# Patient Record
Sex: Female | Born: 1995 | Race: White | Hispanic: No | Marital: Single | Smoking: Never smoker
Health system: Southern US, Community
[De-identification: ages and names within clinical notes are randomized; demographics above are authoritative.]

## PROBLEM LIST (undated history)

## (undated) DIAGNOSIS — Z789 Other specified health status: Secondary | ICD-10-CM

## (undated) DIAGNOSIS — N390 Urinary tract infection, site not specified: Secondary | ICD-10-CM

## (undated) DIAGNOSIS — N200 Calculus of kidney: Secondary | ICD-10-CM

---
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
## 2017-01-27 IMAGING — US US TRANSVAGINAL NON-OB
1 series · 14 of 25 positions shown · non-contrast
Comparison: None

CLINICAL DATA: Pelvic pain since a spontaneous abortion 09/27/2014.



[Series 1: us pelvis complete · 14 of 66 slices shown]
[im 1/66]
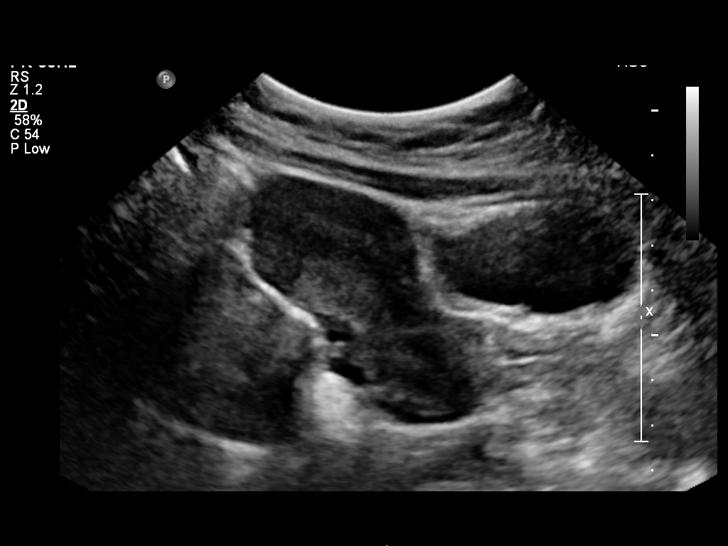
[im 6/66]
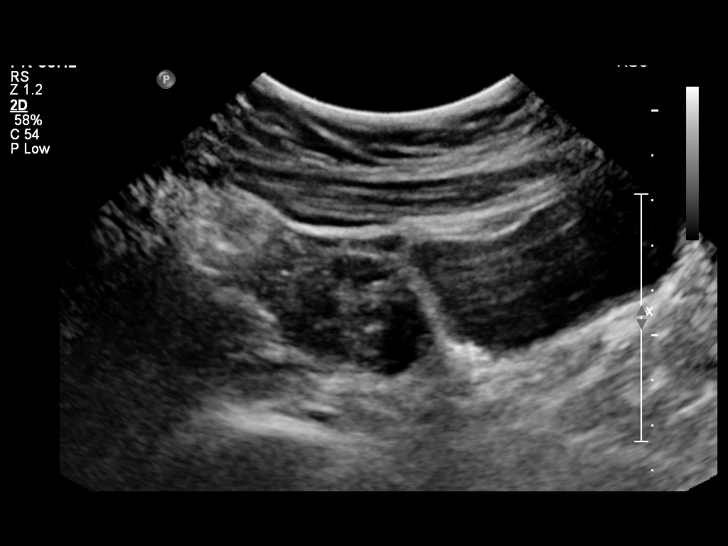
[im 11/66]
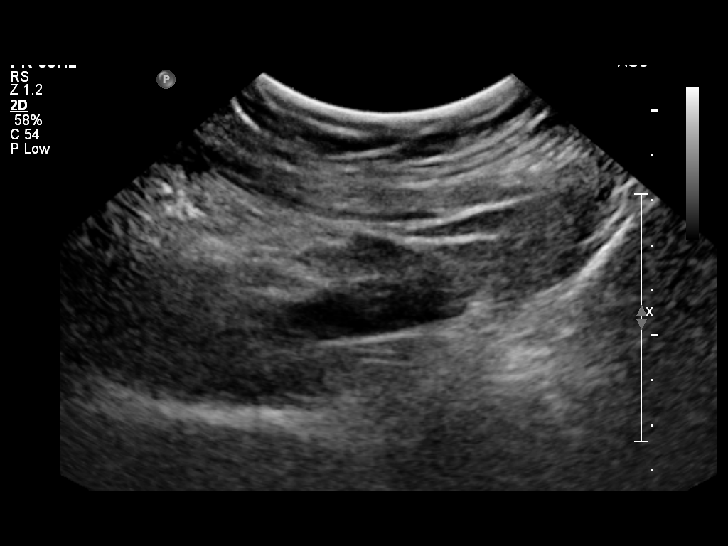
[im 17/66]
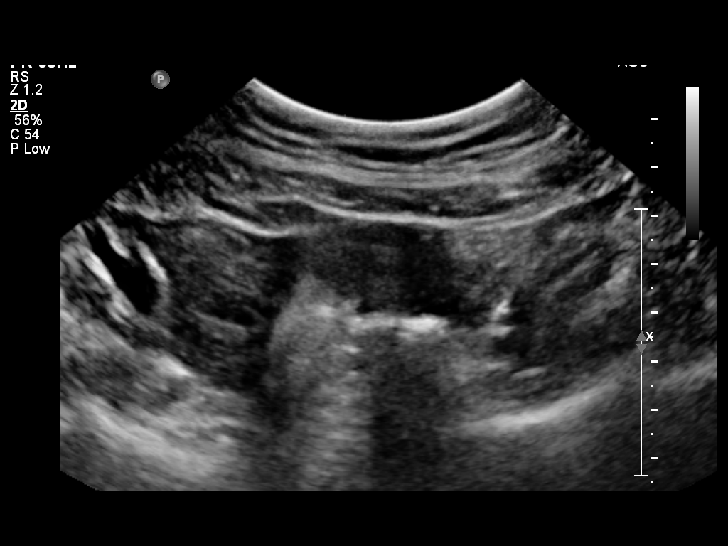
[im 22/66]
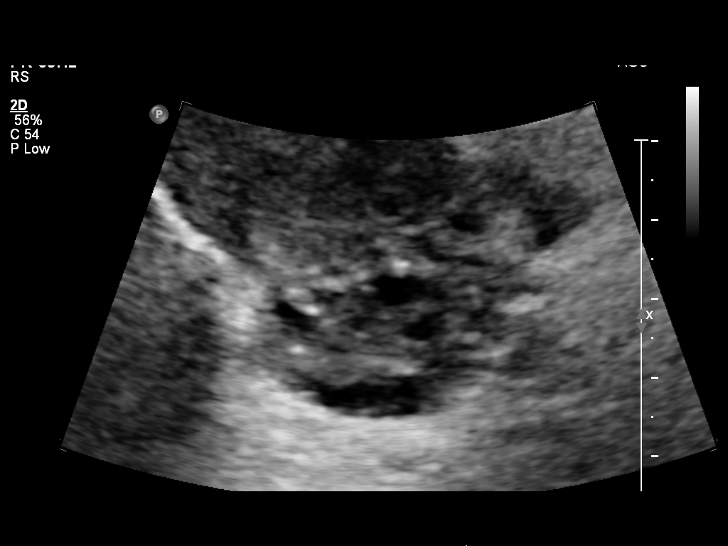
[im 25/66]
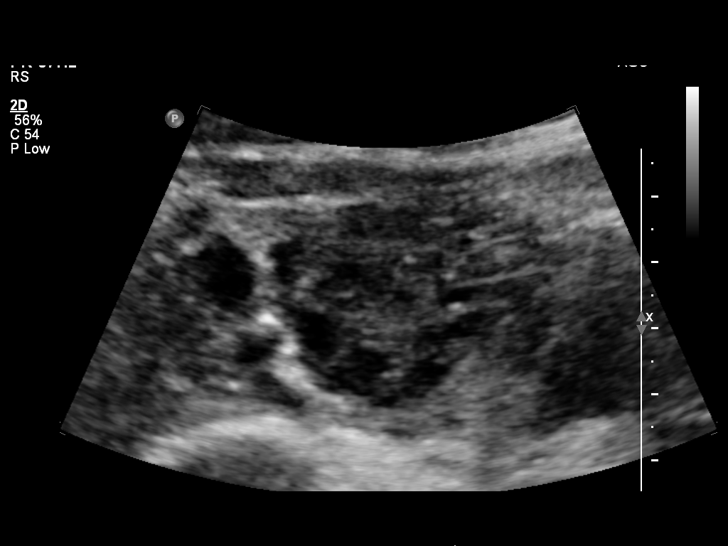
[im 30/66]
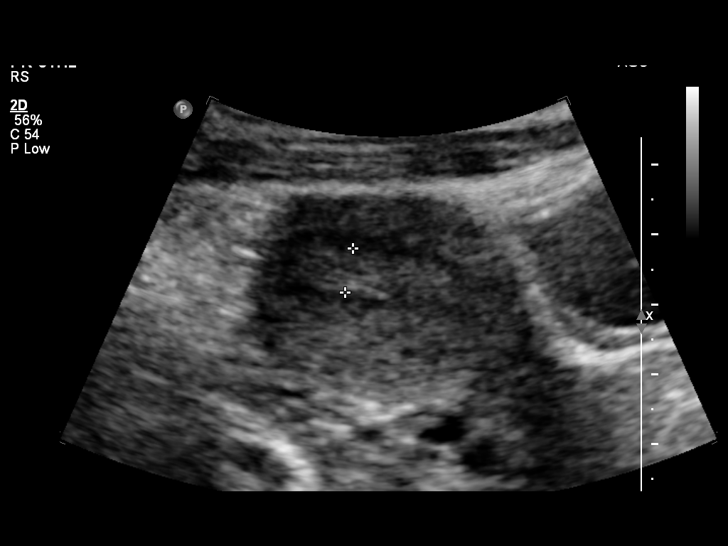
[im 36/66]
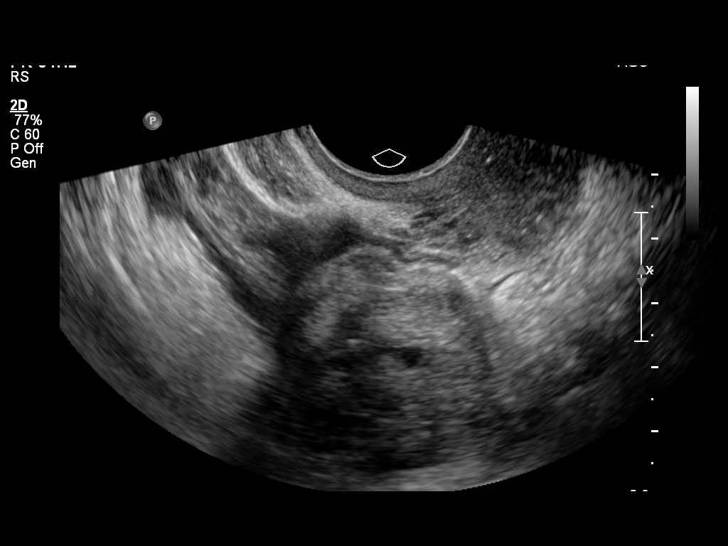
[im 41/66]
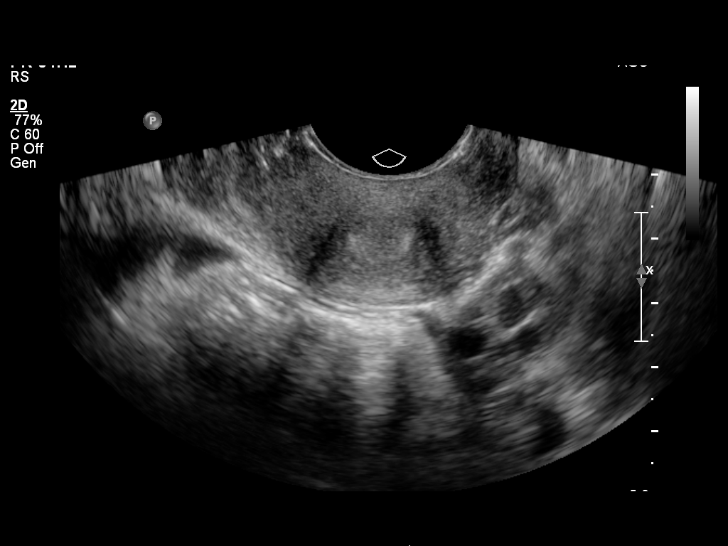
[im 44/66]
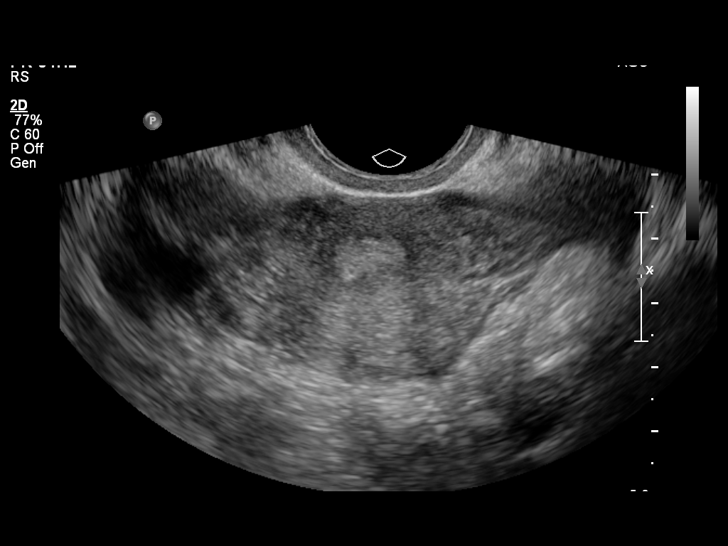
[im 49/66]
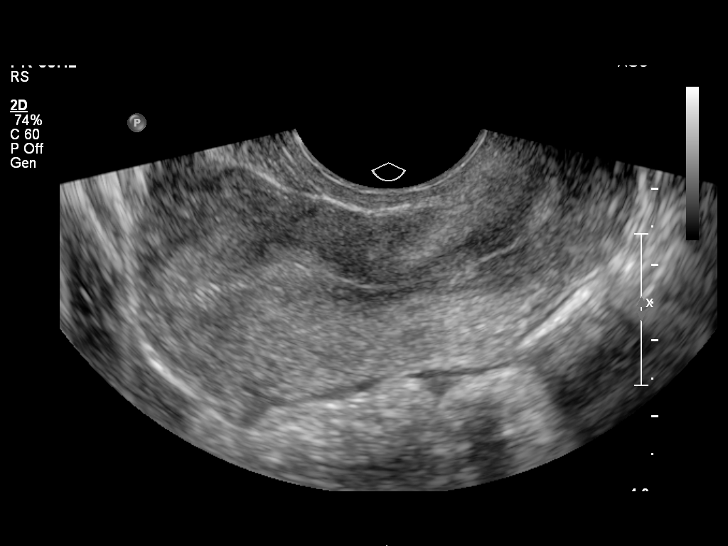
[im 55/66]
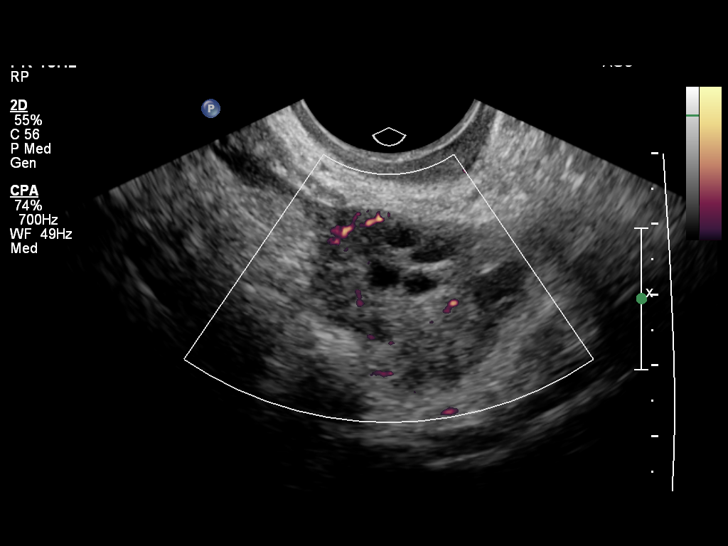
[im 60/66]
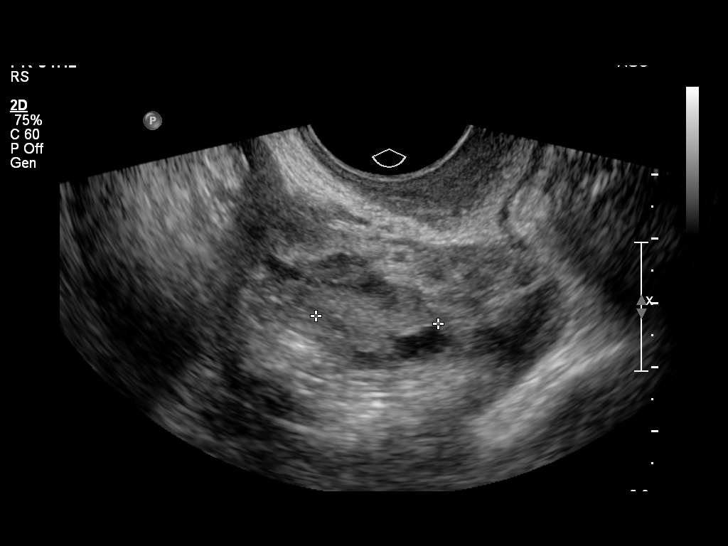
[im 66/66]
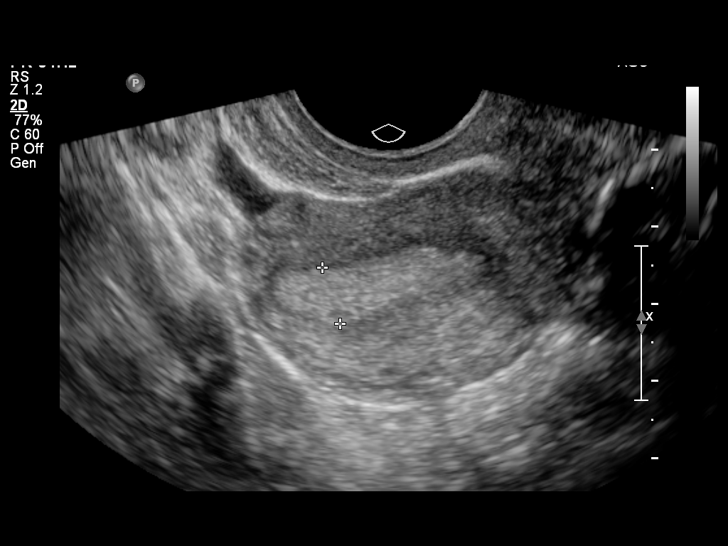

[14 of 25 positions shown; findings below may reference images not displayed]

FINDINGS: Uterus

Measurements: 7.0 x 3.2 x 4.1 cm. No fibroids or other mass
visualized.

Endometrium

Thickness: 0.8 cm.  No focal abnormality visualized.

Right ovary

Measurements: 3.2 x 1.6 x 1.9 cm. Normal appearance/no adnexal mass.

Left ovary

Measurements: 3.3 x 2.5 x 2.3 cm. Normal appearance/no adnexal mass.

Other findings

No free fluid.
IMPRESSION: Normal examination.

## 2021-11-17 ENCOUNTER — Emergency Department (HOSPITAL_BASED_OUTPATIENT_CLINIC_OR_DEPARTMENT_OTHER): Payer: Medicaid Other

## 2021-11-17 ENCOUNTER — Emergency Department (HOSPITAL_BASED_OUTPATIENT_CLINIC_OR_DEPARTMENT_OTHER)
Admission: EM | Admit: 2021-11-17 | Discharge: 2021-11-17 | Disposition: A | Discharge status: Home / Self Care | Payer: Medicaid Other | Attending: Emergency Medicine | Admitting: Emergency Medicine

## 2021-11-17 ENCOUNTER — Encounter (HOSPITAL_BASED_OUTPATIENT_CLINIC_OR_DEPARTMENT_OTHER): Payer: Self-pay

## 2021-11-17 ENCOUNTER — Other Ambulatory Visit: Payer: Self-pay

## 2021-11-17 DIAGNOSIS — R1011 Right upper quadrant pain: Secondary | ICD-10-CM | POA: Diagnosis present

## 2021-11-17 DIAGNOSIS — R109 Unspecified abdominal pain: Secondary | ICD-10-CM

## 2021-11-17 HISTORY — DX: Urinary tract infection, site not specified: N39.0

## 2021-11-17 LAB — URINALYSIS, ROUTINE W REFLEX MICROSCOPIC
Bilirubin Urine: NEGATIVE
Glucose, UA: NEGATIVE mg/dL
Hgb urine dipstick: NEGATIVE
Ketones, ur: NEGATIVE mg/dL
Leukocytes,Ua: NEGATIVE
Nitrite: NEGATIVE
Protein, ur: NEGATIVE mg/dL
Specific Gravity, Urine: 1.015 (ref 1.005–1.030)
pH: 7 (ref 5.0–8.0)

## 2021-11-17 LAB — COMPREHENSIVE METABOLIC PANEL
ALT: 13 U/L (ref 0–44)
AST: 15 U/L (ref 15–41)
Albumin: 4.1 g/dL (ref 3.5–5.0)
Alkaline Phosphatase: 33 U/L — ABNORMAL LOW (ref 38–126)
Anion gap: 7 (ref 5–15)
BUN: 13 mg/dL (ref 6–20)
CO2: 26 mmol/L (ref 22–32)
Calcium: 9.1 mg/dL (ref 8.9–10.3)
Chloride: 104 mmol/L (ref 98–111)
Creatinine, Ser: 0.66 mg/dL (ref 0.44–1.00)
GFR, Estimated: >60 mL/min (ref >60)
Glucose, Bld: 88 mg/dL (ref 70–99)
Potassium: 3.8 mmol/L (ref 3.5–5.1)
Sodium: 137 mmol/L (ref 135–145)
Total Bilirubin: 0.4 mg/dL (ref 0.3–1.2)
Total Protein: 7.1 g/dL (ref 6.5–8.1)

## 2021-11-17 LAB — CBC
HCT: 42.6 % (ref 36.0–46.0)
Hemoglobin: 14.3 g/dL (ref 12.0–15.0)
MCH: 29.1 pg (ref 26.0–34.0)
MCHC: 33.6 g/dL (ref 30.0–36.0)
MCV: 86.8 fL (ref 80.0–100.0)
Platelets: 257 10*3/uL (ref 150–400)
RBC: 4.91 MIL/uL (ref 3.87–5.11)
RDW: 13.2 % (ref 11.5–15.5)
WBC: 7 10*3/uL (ref 4.0–10.5)
nRBC: 0 % (ref 0.0–0.2)

## 2021-11-17 LAB — PREGNANCY, URINE: Preg Test, Ur: NEGATIVE

## 2021-11-17 LAB — LIPASE, BLOOD: Lipase: 23 U/L (ref 11–51)

## 2021-11-17 MED ORDER — SODIUM CHLORIDE 0.9 % IV BOLUS
1000.0000 mL | Freq: Once | INTRAVENOUS | Status: AC
  Administered 2021-11-17: 1000 mL via INTRAVENOUS

## 2021-11-17 MED ORDER — SUCRALFATE 1 G PO TABS: 1.0000 g | ORAL_TABLET | Freq: Three times a day (TID) | ORAL | 0 refills | Status: DC

## 2021-11-17 MED ORDER — DICYCLOMINE HCL 10 MG PO CAPS
10.0000 mg | ORAL_CAPSULE | Freq: Once | ORAL | Status: AC
  Administered 2021-11-17: 10 mg via ORAL
  Filled 2021-11-17: qty 1

## 2021-11-17 MED ORDER — DICYCLOMINE HCL 20 MG PO TABS: 20.0000 mg | ORAL_TABLET | Freq: Two times a day (BID) | ORAL | 0 refills | Status: AC | PRN

## 2021-11-17 MED ORDER — FAMOTIDINE IN NACL 20-0.9 MG/50ML-% IV SOLN
20.0000 mg | Freq: Once | INTRAVENOUS | Status: AC
  Administered 2021-11-17: 20 mg via INTRAVENOUS
  Filled 2021-11-17: qty 50

## 2021-11-17 MED ORDER — PANTOPRAZOLE SODIUM 20 MG PO TBEC: 20.0000 mg | DELAYED_RELEASE_TABLET | Freq: Every day | ORAL | 0 refills | Status: DC

## 2021-11-17 MED ORDER — LIDOCAINE VISCOUS HCL 2 % MT SOLN
15.0000 mL | Freq: Once | OROMUCOSAL | Status: AC
  Administered 2021-11-17: 15 mL via ORAL
  Filled 2021-11-17: qty 15

## 2021-11-17 MED ORDER — ALUM & MAG HYDROXIDE-SIMETH 200-200-20 MG/5ML PO SUSP
30.0000 mL | Freq: Once | ORAL | Status: AC
  Administered 2021-11-17: 30 mL via ORAL
  Filled 2021-11-17: qty 30

## 2021-11-17 NOTE — ED Triage Notes (Addendum)
Pt reports had an ultrasound of GB approx 58months ago told she had sludge in gallbladder. Pt reports right abdominal pain since last week. Stomach gurgling Wakes up with pain and some diarrhea and reports bitter taste in mouth. Also has tender breast ?

## 2021-11-17 NOTE — ED Notes (Signed)
Patient transported to Ultrasound 

## 2021-11-17 NOTE — ED Notes (Signed)
Pt tolerating PO liquids, ambulatory to restroom at this time. ?

## 2021-11-17 NOTE — Discharge Instructions (Addendum)
It was a pleasure caring for you today in the emergency department.  Please return to the emergency department for any worsening or worrisome symptoms.    There are many causes of abdominal pain. Most pain is not serious and goes away, but some pain gets worse, changes, or will not go away. Please return to the emergency department or see your doctor right away if you (or your family member) experience any of the following:  1. Pain that gets worse or moves to just one spot.  2. Pain that gets worse if you cough or sneeze.  3. Pain with going over a bump in the road.  4. Pain that does not get better in 24 hours.  5. Inability to keep down liquids (vomiting)-especially if you are making less urine.  6. Fainting.  7. Blood in the vomit or stool.  8. High fever or shaking chills.  9. Swelling of the abdomen.  10. Any new or worsening problem.      Follow-up Instructions  See your primary care provider if not completely better in the next 2-3 days. Come to the ED if you are unable to see them in this time frame.    Additional Instructions  No alcohol.  No caffeine, aspirin, or cigarettes.   Please return to the emergency department immediately for any new or concerning symptoms, or if you get worse.  

## 2021-11-17 NOTE — ED Provider Notes (Signed)
?[facility] EMERGENCY DEPARTMENT ?Provider Note ? ? ?CSN: [id number] ?Arrival date & time: 11/17/21  0908 ? ?  ? ?History ? ?Chief Complaint  ?Patient presents with  ? Abdominal Pain  ? ? ?Jillian Simpson is a 26 y.o. female. ? ? Patient as above with significant medical history as below, including UTI, gallbladder sludge who presents to the ED with complaint of right upper quadrant abdominal pain, "gurgling." ? ?Has been ongoing for about 2 weeks.  Feels like it is mildly improved since the onset.  Sometimes worse after eating, yesterday she ate something onions in it and it made her stomach feel worse.  Mild nausea without vomiting.  Pain is occasionally cramping to the upper quadrant.  Also having some increased dysuria, increased urinary frequency.  Thinks might have UTI.  No abnormal vaginal bleeding or discharge.  No emesis.  No trauma.  No recent diet or medication changes.  No medications prior to arrival for treatment of her symptoms. ? ? ? ? ?Past Medical History: ?No date: Medical history non-contributory ?No date: UTI (urinary tract infection) ?    Comment:  recurrent ? ?Past Surgical History: ?No date: CESAREAN SECTION  ? ? ?The history is provided by the patient. No language interpreter was used.  ?Abdominal Pain ?Associated symptoms: dysuria   ?Associated symptoms: no chest pain, no chills, no cough, no fever, no hematuria, no nausea, no shortness of breath and no vomiting   ? ?  ? ?Home Medications ?Prior to Admission medications   ?Medication Sig Start Date End Date Taking? Authorizing Provider  ?acetaminophen (TYLENOL) 500 MG tablet Take 1,000 mg by mouth every 6 (six) hours as needed for mild pain or headache.    [provider]  ?ibuprofen (ADVIL,MOTRIN) 600 MG tablet Take 1 tablet (600 mg total) by mouth every 6 (six) hours as needed. 11/24/14   Leftwich-Kirby, Wilmer Floor, CNM  ?metroNIDAZOLE (FLAGYL) 500 MG tablet Take 500 mg by mouth 2 (two) times daily. 11/22/14   [provider]  ?oxyCODONE-acetaminophen (PERCOCET/ROXICET) 5-325 MG per tablet Take 1-2 tablets by mouth every 6 (six) hours as needed. 11/24/14   Leftwich-Kirby, Wilmer Floor, CNM  ?   ? ?Allergies    ?Patient has no known allergies.   ? ?Review of Systems   ?Review of Systems  ?Constitutional:  Negative for chills and fever.  ?HENT:  Negative for facial swelling and trouble swallowing.   ?Eyes:  Negative for photophobia and visual disturbance.  ?Respiratory:  Negative for cough and shortness of breath.   ?Cardiovascular:  Negative for chest pain and palpitations.  ?Gastrointestinal:  Positive for abdominal pain. Negative for nausea and vomiting.  ?Endocrine: Negative for polydipsia and polyuria.  ?Genitourinary:  Positive for dysuria and frequency. Negative for difficulty urinating and hematuria.  ?Musculoskeletal:  Negative for gait problem and joint swelling.  ?Skin:  Negative for pallor and rash.  ?Neurological:  Negative for syncope and headaches.  ?Psychiatric/Behavioral:  Negative for agitation and confusion.   ? ?Physical Exam ?Updated Vital Signs ?BP 107/78   Pulse 84   Temp 98.3 ?F (36.8 ?C) (Oral)   Resp 16   Ht 4\' 10"  (1.473 m)   Wt 48.1 kg   LMP 10/30/2021 (Approximate) Comment: on BCP  SpO2 100%   BMI 22.15 kg/m?  ?Physical Exam ?Vitals and nursing note reviewed.  ?Constitutional:   ?   General: She is not in acute distress. ?   Appearance: Normal appearance. She is well-developed. She is  not ill-appearing, toxic-appearing or diaphoretic.  ?HENT:  ?   Head: Normocephalic and atraumatic.  ?   Right Ear: External ear normal.  ?   Left Ear: External ear normal.  ?   Nose: Nose normal.  ?   Mouth/Throat:  ?   Mouth: Mucous membranes are moist.  ?Eyes:  ?   General: No scleral icterus.    ?   Right eye: No discharge.     ?   Left eye: No discharge.  ?Cardiovascular:  ?   Rate and Rhythm: Normal rate and regular rhythm.  ?   Pulses: Normal pulses.  ?   Heart sounds: Normal heart sounds.  ?Pulmonary:  ?    Effort: Pulmonary effort is normal. No respiratory distress.  ?   Breath sounds: Normal breath sounds.  ?Abdominal:  ?   General: Abdomen is flat.  ?   Palpations: Abdomen is soft.  ?   Tenderness: There is no abdominal tenderness. There is no guarding or rebound. Negative signs include Murphy's sign.  ?Musculoskeletal:     ?   General: Normal range of motion.  ?   Cervical back: Normal range of motion.  ?   Right lower leg: No edema.  ?   Left lower leg: No edema.  ?Skin: ?   General: Skin is warm and dry.  ?   Capillary Refill: Capillary refill takes less than 2 seconds.  ?Neurological:  ?   Mental Status: She is alert and oriented to person, place, and time.  ?   GCS: GCS eye subscore is 4. GCS verbal subscore is 5. GCS motor subscore is 6.  ?Psychiatric:     ?   Mood and Affect: Mood normal.     ?   Behavior: Behavior normal.  ? ? ?ED Results / Procedures / Treatments   ?Labs ?(all labs ordered are listed, but only abnormal results are displayed) ?Labs Reviewed  ?COMPREHENSIVE METABOLIC PANEL - Abnormal; Notable for the following components:  ?    Result Value  ? Alkaline Phosphatase 33 (*)   ? All other components within normal limits  ?LIPASE, BLOOD  ?CBC  ?URINALYSIS, ROUTINE W REFLEX MICROSCOPIC  ?PREGNANCY, URINE  ? ? ?EKG ?None ? ?Radiology ?US Abdomen Limited RUQ (LIVER/GB) ? ?Result Date: 11/17/2021 ?CLINICAL DATA:  Sludge in gallbladder EXAM: ULTRASOUND ABDOMEN LIMITED RIGHT UPPER QUADRANT COMPARISON:  None. FINDINGS: Gallbladder: No gallstones or wall thickening visualized. No sludge visualized. No sonographic Murphy sign noted by sonographer. Common bile duct: Diameter: 1.8 mm Liver: No focal lesion identified. Within normal limits in parenchymal echogenicity. Portal vein is patent on color Doppler imaging with normal direction of blood flow towards the liver. Other: None. IMPRESSION: Normal sonographic appearance of the gallbladder. Electronically Signed   By: Allegra Lai M.D.   On: 11/17/2021  10:56   ? ?Procedures ?Procedures  ? ? ?Medications Ordered in ED ?Medications  ?famotidine (PEPCID) IVPB 20 mg premix (0 mg Intravenous Stopped 11/17/21 1115)  ?alum & mag hydroxide-simeth (MAALOX/MYLANTA) 200-200-20 MG/5ML suspension 30 mL (30 mLs Oral Given 11/17/21 1032)  ?  And  ?lidocaine (XYLOCAINE) 2 % viscous mouth solution 15 mL (15 mLs Oral Given 11/17/21 1032)  ?sodium chloride 0.9 % bolus 1,000 mL (0 mLs Intravenous Stopped 11/17/21 1154)  ?dicyclomine (BENTYL) capsule 10 mg (10 mg Oral Given 11/17/21 1154)  ? ? ?ED Course/ Medical Decision Making/ A&P ?  ?                        ?  Medical Decision Making ?Amount and/or Complexity of Data Reviewed ?Labs: ordered. ?Radiology: ordered. ? ?Risk ?OTC drugs. ?Prescription drug management. ? ? ? ?CC: Right upper quadrant, epigastric pain, concern for UTI ? ?This patient presents to the Emergency Department for the above complaint. This involves an extensive number of treatment options and is a complaint that carries with it a high risk of complications and morbidity. Vital signs were reviewed. Serious etiologies considered. ? ?Differential includes but not limited to biliary colic, gastritis, pancreatitis, cholecystitis, cholelithiasis, gastroenteritis, MSK, UTI, pyelo-, nephrolithiasis ? ?Record review:  ?Previous records obtained and reviewed prior PCP visits, prior imaging, prior labs, PDMP ? ?Additional history obtained from N/A ? ?Medical and surgical history as noted above.  ? ?Work up as above, notable for:  ? ?Labs & imaging results that were available during my care of the patient were visualized by me and considered in my medical decision making. ?  ?I ordered imaging studies which included right upper quadrant ultrasound and I visualized the imaging and I agree with radiologist interpretation. RUQ u/s without acute pathology noted. ? ?Cardiac monitoring reviewed and interpreted personally which shows NSR ? ?Obtain laboratory evaluation for abdominal  pain.  Urinalysis. ? ?Management: ?Give GI cocktail, IV fluids ? ?Reassessment:  ?Patient reports she is feeling much better, she is telling p.o. intake with difficulty.  Repeat abdominal exam is soft, nontender, nonper

## 2022-04-29 ENCOUNTER — Encounter (HOSPITAL_BASED_OUTPATIENT_CLINIC_OR_DEPARTMENT_OTHER): Payer: Self-pay | Admitting: Pediatrics

## 2022-04-29 ENCOUNTER — Other Ambulatory Visit: Payer: Self-pay

## 2022-04-29 ENCOUNTER — Emergency Department (HOSPITAL_BASED_OUTPATIENT_CLINIC_OR_DEPARTMENT_OTHER)
Admission: EM | Admit: 2022-04-29 | Discharge: 2022-04-29 | Disposition: A | Discharge status: Home / Self Care | Payer: Medicaid Other | Attending: Emergency Medicine | Admitting: Emergency Medicine

## 2022-04-29 DIAGNOSIS — R350 Frequency of micturition: Secondary | ICD-10-CM | POA: Diagnosis not present

## 2022-04-29 DIAGNOSIS — R300 Dysuria: Secondary | ICD-10-CM | POA: Diagnosis not present

## 2022-04-29 DIAGNOSIS — N3289 Other specified disorders of bladder: Secondary | ICD-10-CM | POA: Insufficient documentation

## 2022-04-29 LAB — URINALYSIS, ROUTINE W REFLEX MICROSCOPIC
Bilirubin Urine: NEGATIVE
Glucose, UA: NEGATIVE mg/dL
Ketones, ur: NEGATIVE mg/dL
Leukocytes,Ua: NEGATIVE
Nitrite: NEGATIVE
Protein, ur: NEGATIVE mg/dL
Specific Gravity, Urine: 1.025 (ref 1.005–1.030)
pH: 6.5 (ref 5.0–8.0)

## 2022-04-29 LAB — URINALYSIS, MICROSCOPIC (REFLEX)

## 2022-04-29 LAB — PREGNANCY, URINE: Preg Test, Ur: NEGATIVE

## 2022-04-29 MED ORDER — OXYBUTYNIN CHLORIDE ER 10 MG PO TB24: 10.0000 mg | ORAL_TABLET | Freq: Every day | ORAL | 0 refills | Status: AC

## 2022-04-29 MED ORDER — TAMSULOSIN HCL 0.4 MG PO CAPS
0.4000 mg | ORAL_CAPSULE | Freq: Once | ORAL | Status: AC
  Administered 2022-04-29: 0.4 mg via ORAL
  Filled 2022-04-29: qty 1

## 2022-04-29 MED ORDER — OXYBUTYNIN CHLORIDE 5 MG PO TABS
5.0000 mg | ORAL_TABLET | Freq: Once | ORAL | Status: DC
  Filled 2022-04-29: qty 1

## 2022-04-29 MED ORDER — OXYCODONE-ACETAMINOPHEN 5-325 MG PO TABS
1.0000 | ORAL_TABLET | Freq: Once | ORAL | Status: AC
  Administered 2022-04-29: 1 via ORAL
  Filled 2022-04-29: qty 1

## 2022-04-29 MED ORDER — ONDANSETRON HCL 4 MG PO TABS: 4.0000 mg | ORAL_TABLET | Freq: Four times a day (QID) | ORAL | 0 refills | Status: DC

## 2022-04-29 NOTE — ED Provider Notes (Signed)
[facility] EMERGENCY DEPARTMENT Provider Note   CSN: [id number] Arrival date & time: 04/29/22  1429     History  Chief Complaint  Patient presents with   Urinary Frequency   Dysuria    Jillian Simpson is a 26 y.o. female with past medical history significant for previous frequent urinary tract infections, recent diagnosis of urethral stenosis, who presents with concern for ongoing pressure in urethra, urgency, dysuria after completing 5-day course of Macrobid.  Urine 5 days ago was nitrite positive with many bacteria, urine was reportedly sent from culture during last visit but no culture results are available on chart review.  Patient denies any vaginal discharge, itching, dyspareunia, abnormal vaginal bleeding.  Denies fever, chills, reports that she has had some intermittent nausea on and off.   Urinary Frequency  Dysuria      Home Medications Prior to Admission medications   Medication Sig Start Date End Date Taking? Authorizing Provider  ondansetron (ZOFRAN) 4 MG tablet Take 1 tablet (4 mg total) by mouth every 6 (six) hours. 04/29/22  Yes Yaire Kreher H, PA-C  oxybutynin (DITROPAN XL) 10 MG 24 hr tablet Take 1 tablet (10 mg total) by mouth at bedtime. 04/29/22  Yes Daelyn Pettaway H, PA-C  acetaminophen (TYLENOL) 500 MG tablet Take 1,000 mg by mouth every 6 (six) hours as needed for mild pain or headache.    [provider]  dicyclomine (BENTYL) 20 MG tablet Take 1 tablet (20 mg total) by mouth 2 (two) times daily as needed for spasms. 11/17/21   Sloan Leiter, DO  ibuprofen (ADVIL,MOTRIN) 600 MG tablet Take 1 tablet (600 mg total) by mouth every 6 (six) hours as needed. 11/24/14   Leftwich-Kirby, Wilmer Floor, CNM  metroNIDAZOLE (FLAGYL) 500 MG tablet Take 500 mg by mouth 2 (two) times daily. 11/22/14   [provider]  oxyCODONE-acetaminophen (PERCOCET/ROXICET) 5-325 MG per tablet Take 1-2 tablets by mouth every 6 (six) hours as needed.  11/24/14   Leftwich-Kirby, Wilmer Floor, CNM  pantoprazole (PROTONIX) 20 MG tablet Take 1 tablet (20 mg total) by mouth daily for 14 days. 11/17/21 12/01/21  Sloan Leiter, DO  sucralfate (CARAFATE) 1 g tablet Take 1 tablet (1 g total) by mouth 4 (four) times daily -  with meals and at bedtime for 5 days. 11/17/21 11/22/21  Sloan Leiter, DO      Allergies    Patient has no known allergies.    Review of Systems   Review of Systems  Genitourinary:  Positive for dysuria and frequency.  All other systems reviewed and are negative.   Physical Exam Updated Vital Signs BP 99/64   Pulse 73   Temp 98.8 F (37.1 C) (Oral)   Resp 17   Ht 4\' 11"  (1.499 m)   Wt 49.9 kg   LMP 04/05/2022   SpO2 100%   BMI 22.22 kg/m  Physical Exam Vitals and nursing note reviewed.  Constitutional:      General: She is not in acute distress.    Appearance: Normal appearance.  HENT:     Head: Normocephalic and atraumatic.  Eyes:     General:        Right eye: No discharge.        Left eye: No discharge.  Cardiovascular:     Rate and Rhythm: Normal rate and regular rhythm.     Heart sounds: No murmur heard.    No friction rub. No gallop.  Pulmonary:  Effort: Pulmonary effort is normal.     Breath sounds: Normal breath sounds.  Abdominal:     General: Bowel sounds are normal.     Palpations: Abdomen is soft.     Comments: Minimal tenderness to palpation without significant distention noted on lower abdomen  Skin:    General: Skin is warm and dry.     Capillary Refill: Capillary refill takes less than 2 seconds.  Neurological:     Mental Status: She is alert and oriented to person, place, and time.  Psychiatric:        Mood and Affect: Mood normal.        Behavior: Behavior normal.     ED Results / Procedures / Treatments   Labs (all labs ordered are listed, but only abnormal results are displayed) Labs Reviewed  URINALYSIS, ROUTINE W REFLEX MICROSCOPIC - Abnormal; Notable for the following  components:      Result Value   Hgb urine dipstick SMALL (*)    All other components within normal limits  URINALYSIS, MICROSCOPIC (REFLEX) - Abnormal; Notable for the following components:   Bacteria, UA MANY (*)    All other components within normal limits  URINE CULTURE  PREGNANCY, URINE    EKG None  Radiology No results found.  Procedures Procedures    Medications Ordered in ED Medications  oxyCODONE-acetaminophen (PERCOCET/ROXICET) 5-325 MG per tablet 1 tablet (1 tablet Oral Given 04/29/22 1718)  tamsulosin (FLOMAX) capsule 0.4 mg (0.4 mg Oral Given 04/29/22 1802)    ED Course/ Medical Decision Making/ A&P                           Medical Decision Making Amount and/or Complexity of Data Reviewed Labs: ordered.  Risk Prescription drug management.   Patient overall well-appearing patient with stable vital signs presents with concern for ongoing dysuria, urinary pressure with history of ureteral stenosis.  Patient was recently treated for UTI with Macrobid, wait 5-day course but is still having symptoms, feeling of some incomplete emptying.  She reports some intermittent nausea but denies any fever, chills.  Independently interpreted urinalysis which shows bacteria but without leukocytes, nitrites, small hemoglobin.  There is some contamination with squamous cells, is difficult to say whether this represents a true UTI, I have low suspicion based on results for acute UTI especially with patient with history of some more structural urinary discomfort, urinary spasm, I am more suspicious for bladder spasm and inflammation than an acute infectious process.  Performed shared decision making with patient, she declined any pelvic exam.  We discussed prophylactic antibiotic versus waiting for culture, we will opt to wait for culture results, in the meantime we will try some oxybutynin to help with possible bladder spasm.  Patient understands and agrees to this plan, and is  discharged in stable condition at this time. Final Clinical Impression(s) / ED Diagnoses Final diagnoses:  Dysuria  Bladder spasm    Rx / DC Orders ED Discharge Orders          Ordered    oxybutynin (DITROPAN XL) 10 MG 24 hr tablet  Daily at bedtime        04/29/22 1820    ondansetron (ZOFRAN) 4 MG tablet  Every 6 hours        04/29/22 1820              Anselmo Pickler, PA-C 04/29/22 1821    Malvin Johns, MD 04/29/22 2205

## 2022-04-29 NOTE — ED Notes (Addendum)
Reports urinary frequency ,some nausea, states that she keeps a full bladder,denies any vaginal discharge.  States that she has recurrent uti. Currently taking an antibiotic  abdominal tenderness. Reports some dysuria

## 2022-04-29 NOTE — ED Notes (Signed)
Bladder Scan 106ml

## 2022-04-29 NOTE — Discharge Instructions (Addendum)
Gust I sent your urine for culture, if it does grow bacteria that seem to be infectious in nature our pharmacist will call you and we will send you an antibiotic to cover for this, otherwise recommend that you follow-up with your PCP and urologist as you have planned, you can try the new medication that I prescribed to help with your spasm symptoms.  I additionally sent you some nausea medication to take as needed.

## 2022-04-29 NOTE — ED Triage Notes (Signed)
Reported hx of re-current UTIs; stated she recently finished 5 day course of Macrobid (yesterday) c/o pressure in urethra and increased urgency and pain.

## 2022-04-30 LAB — URINE CULTURE: Culture: NO GROWTH

## 2022-06-16 ENCOUNTER — Emergency Department (HOSPITAL_BASED_OUTPATIENT_CLINIC_OR_DEPARTMENT_OTHER): Payer: Medicaid Other

## 2022-06-16 ENCOUNTER — Emergency Department (HOSPITAL_BASED_OUTPATIENT_CLINIC_OR_DEPARTMENT_OTHER)
Admission: EM | Admit: 2022-06-16 | Discharge: 2022-06-16 | Disposition: A | Discharge status: Home / Self Care | Payer: Medicaid Other | Attending: Emergency Medicine | Admitting: Emergency Medicine

## 2022-06-16 ENCOUNTER — Encounter (HOSPITAL_BASED_OUTPATIENT_CLINIC_OR_DEPARTMENT_OTHER): Payer: Self-pay | Admitting: Emergency Medicine

## 2022-06-16 ENCOUNTER — Other Ambulatory Visit: Payer: Self-pay

## 2022-06-16 DIAGNOSIS — U071 COVID-19: Secondary | ICD-10-CM | POA: Insufficient documentation

## 2022-06-16 DIAGNOSIS — R059 Cough, unspecified: Secondary | ICD-10-CM | POA: Diagnosis present

## 2022-06-16 DIAGNOSIS — R079 Chest pain, unspecified: Secondary | ICD-10-CM

## 2022-06-16 DIAGNOSIS — R0789 Other chest pain: Secondary | ICD-10-CM | POA: Insufficient documentation

## 2022-06-16 LAB — URINALYSIS, ROUTINE W REFLEX MICROSCOPIC
Bilirubin Urine: NEGATIVE
Glucose, UA: NEGATIVE mg/dL
Hgb urine dipstick: NEGATIVE
Ketones, ur: NEGATIVE mg/dL
Leukocytes,Ua: NEGATIVE
Nitrite: NEGATIVE
Protein, ur: NEGATIVE mg/dL
Specific Gravity, Urine: 1.02 (ref 1.005–1.030)
pH: 8 (ref 5.0–8.0)

## 2022-06-16 LAB — RESP PANEL BY RT-PCR (FLU A&B, COVID) ARPGX2
Influenza A by PCR: NEGATIVE
Influenza B by PCR: NEGATIVE
SARS Coronavirus 2 by RT PCR: POSITIVE — AB

## 2022-06-16 LAB — PREGNANCY, URINE: Preg Test, Ur: NEGATIVE

## 2022-06-16 MED ORDER — FAMOTIDINE 20 MG PO TABS
20.0000 mg | ORAL_TABLET | Freq: Once | ORAL | Status: AC
  Administered 2022-06-16: 20 mg via ORAL
  Filled 2022-06-16: qty 1

## 2022-06-16 MED ORDER — FAMOTIDINE 20 MG PO TABS: 20.0000 mg | ORAL_TABLET | Freq: Every day | ORAL | 0 refills | Status: DC

## 2022-06-16 MED ORDER — ALUM & MAG HYDROXIDE-SIMETH 200-200-20 MG/5ML PO SUSP
30.0000 mL | Freq: Once | ORAL | Status: AC
  Administered 2022-06-16: 30 mL via ORAL
  Filled 2022-06-16: qty 30

## 2022-06-16 NOTE — Discharge Instructions (Signed)
You were seen in the emergency department for your chest pain and rattling.  Your work-up showed no signs of pneumonia and you had a normal EKG looking at your heart rhythm.  Here urine had no signs of infection.  Your COVID test did come back positive which is likely causing your increased mucus recently.  Because the symptoms have been going on for a while it may still be related to acid reflux as well and you can continue to take the antacid as well as Tylenol or Motrin as needed for fevers or body aches.  You should follow-up with your primary doctor in the next few days to have your symptoms rechecked.  You should return to the emergency department if you have significantly worsening chest pain, worsening shortness of breath, you pass out or if you have any other new or concerning symptoms.

## 2022-06-16 NOTE — ED Provider Notes (Signed)
[facility] EMERGENCY DEPARTMENT Provider Note   CSN: [id number] Arrival date & time: 06/16/22  1746     History  Chief Complaint  Patient presents with   Chest Pain    Jillian Simpson is a 26 y.o. female.  Patient is a 26 year old female with no significant past medical history presenting to the emergency department with chest pain.  The patient states for the last 2 years she has felt a "rattling" in her right lower part of her chest.  She states that recently she started to have increased chest pressure and has had a cough productive of mucus.  She denies any fevers but states that she does get sweaty in her hands.  She denies any nausea, vomiting or abdominal pain.  She denies any lower extremity swelling.  She denies any history of blood clots, recent hospitalizations or surgeries, recent travels in the car or plane, blood thinner use or hormone use.  She states that she had similar symptoms in the past and was treated with antacids and her symptoms initially improved.  She states that she talked with her primary doctor this week who recommended she come to the emergency department for evaluation.  The history is provided by the patient.  Chest Pain      Home Medications Prior to Admission medications   Medication Sig Start Date End Date Taking? Authorizing Provider  famotidine (PEPCID) 20 MG tablet Take 1 tablet (20 mg total) by mouth daily. 06/16/22  Yes Elayne Snare K, DO  acetaminophen (TYLENOL) 500 MG tablet Take 1,000 mg by mouth every 6 (six) hours as needed for mild pain or headache.    [provider]  dicyclomine (BENTYL) 20 MG tablet Take 1 tablet (20 mg total) by mouth 2 (two) times daily as needed for spasms. 11/17/21   Sloan Leiter, DO  ibuprofen (ADVIL,MOTRIN) 600 MG tablet Take 1 tablet (600 mg total) by mouth every 6 (six) hours as needed. 11/24/14   Leftwich-Kirby, Wilmer Floor, CNM  metroNIDAZOLE (FLAGYL) 500 MG tablet Take 500 mg by  mouth 2 (two) times daily. 11/22/14   [provider]  ondansetron (ZOFRAN) 4 MG tablet Take 1 tablet (4 mg total) by mouth every 6 (six) hours. 04/29/22   Prosperi, Christian H, PA-C  oxybutynin (DITROPAN XL) 10 MG 24 hr tablet Take 1 tablet (10 mg total) by mouth at bedtime. 04/29/22   Prosperi, Christian H, PA-C  oxyCODONE-acetaminophen (PERCOCET/ROXICET) 5-325 MG per tablet Take 1-2 tablets by mouth every 6 (six) hours as needed. 11/24/14   Leftwich-Kirby, Wilmer Floor, CNM  pantoprazole (PROTONIX) 20 MG tablet Take 1 tablet (20 mg total) by mouth daily for 14 days. 11/17/21 12/01/21  Sloan Leiter, DO  sucralfate (CARAFATE) 1 g tablet Take 1 tablet (1 g total) by mouth 4 (four) times daily -  with meals and at bedtime for 5 days. 11/17/21 11/22/21  Sloan Leiter, DO      Allergies    Patient has no known allergies.    Review of Systems   Review of Systems  Cardiovascular:  Positive for chest pain.    Physical Exam Updated Vital Signs BP 95/65 (BP Location: Right Arm)   Pulse 66   Temp 98 F (36.7 C) (Oral)   Resp 13   Ht 4\' 10"  (1.473 m)   Wt 51.7 kg   LMP 06/10/2022   SpO2 99%   BMI 23.83 kg/m  Physical Exam Vitals and nursing note reviewed.  Constitutional:  General: She is not in acute distress.    Appearance: She is well-developed.  HENT:     Head: Normocephalic and atraumatic.  Eyes:     Extraocular Movements: Extraocular movements intact.  Cardiovascular:     Rate and Rhythm: Normal rate and regular rhythm.     Pulses:          Radial pulses are 2+ on the right side and 2+ on the left side.     Heart sounds: Normal heart sounds.  Pulmonary:     Effort: Pulmonary effort is normal.     Breath sounds: Normal breath sounds.  Chest:     Chest wall: No tenderness.  Abdominal:     Palpations: Abdomen is soft.     Tenderness: There is no abdominal tenderness.  Musculoskeletal:        General: Normal range of motion.     Cervical back: Normal range of motion and  neck supple.     Right lower leg: No edema.     Left lower leg: No edema.  Skin:    General: Skin is warm and dry.  Neurological:     General: No focal deficit present.     Mental Status: She is alert and oriented to person, place, and time.  Psychiatric:        Mood and Affect: Mood normal.        Behavior: Behavior normal.     ED Results / Procedures / Treatments   Labs (all labs ordered are listed, but only abnormal results are displayed) Labs Reviewed  RESP PANEL BY RT-PCR (FLU A&B, COVID) ARPGX2 - Abnormal; Notable for the following components:      Result Value   SARS Coronavirus 2 by RT PCR POSITIVE (*)    All other components within normal limits  URINALYSIS, ROUTINE W REFLEX MICROSCOPIC - Abnormal; Notable for the following components:   APPearance CLOUDY (*)    All other components within normal limits  PREGNANCY, URINE    EKG None  Radiology DG Chest 2 View  Result Date: 06/16/2022 CLINICAL DATA:  Pain with productive cough EXAM: CHEST - 2 VIEW COMPARISON:  09/21/2021 FINDINGS: The heart size and mediastinal contours are within normal limits. Both lungs are clear. The visualized skeletal structures are unremarkable. IMPRESSION: No active cardiopulmonary disease. Electronically Signed   By: Jasmine Pang M.D.   On: 06/16/2022 19:15    Procedures Procedures    Medications Ordered in ED Medications  famotidine (PEPCID) tablet 20 mg (has no administration in time range)  alum & mag hydroxide-simeth (MAALOX/MYLANTA) 200-200-20 MG/5ML suspension 30 mL (has no administration in time range)    ED Course/ Medical Decision Making/ A&P Clinical Course as of 06/16/22 2309  Sat Jun 16, 2022  2309 COVID test positive which likely explains her worsening symptoms.  She was recommended continued symptomatic treatment and primary care follow-up. [VK]    Clinical Course User Index [VK] Rexford Maus, DO                           Medical Decision Making This  patient presents to the ED with chief complaint(s) of chest pain with no pertinent past medical history which further complicates the presenting complaint. The complaint involves an extensive differential diagnosis and also carries with it a high risk of complications and morbidity.    The differential diagnosis includes pneumonia, pneumothorax, pulmonary edema, pleural effusion, viral syndrome, ACS, arrhythmia, patient  has no abdominal tenderness making intra-abdominal infection less likely, could be acid reflux  Additional history obtained: Additional history obtained from N/A Records reviewed previous ED records  ED Course and Reassessment: Patient was initially evaluated in triage and had chest x-ray and urine performed.  Chest x-ray showed no acute disease and urine was negative.  She will additionally have EKG and viral panel performed.  I reviewed her previous records and when she was seen here with similar symptoms she was having abdominal pain at that time rather than chest pain.  She had a negative abdominal work-up but was treated with GI cocktail and her symptoms improved.  She will be given Maalox and Pepcid here.  Independent labs interpretation:  The following labs were independently interpreted: COVID-positive otherwise within normal range  Independent visualization of imaging: - I independently visualized the following imaging with scope of interpretation limited to determining acute life threatening conditions related to emergency care: Chest x-ray, which revealed no acute disease  Consultation: - Consulted or discussed management/test interpretation w/ external professional: N/A  Consideration for admission or further workup: Patient has no emergent conditions requiring admission or further work-up at this time and is stable for discharge home with primary care follow-up Social Determinants of health: N/A    Amount and/or Complexity of Data Reviewed Labs:  ordered. Radiology: ordered.  Risk OTC drugs.          Final Clinical Impression(s) / ED Diagnoses Final diagnoses:  COVID-19  Nonspecific chest pain    Rx / DC Orders ED Discharge Orders          Ordered    famotidine (PEPCID) 20 MG tablet  Daily        06/16/22 2255              Rexford Maus, DO 06/16/22 2309

## 2022-06-16 NOTE — ED Notes (Signed)
Pt is providing urine at this time.

## 2022-06-16 NOTE — ED Notes (Signed)
Unable to provide urine sample will ask again when pt is put in a exam room.

## 2022-06-16 NOTE — ED Triage Notes (Addendum)
Pt c/o RT side CP and "rattling" for over a year; productive cough recently; sts she thought she heard wheezing last night; has seen PCP for same; also thinks she might have a UTI

## 2022-07-20 ENCOUNTER — Other Ambulatory Visit: Payer: Self-pay

## 2022-07-20 ENCOUNTER — Emergency Department (HOSPITAL_BASED_OUTPATIENT_CLINIC_OR_DEPARTMENT_OTHER)
Admission: EM | Admit: 2022-07-20 | Discharge: 2022-07-20 | Disposition: A | Discharge status: Home / Self Care | Payer: Medicaid Other | Attending: Emergency Medicine | Admitting: Emergency Medicine

## 2022-07-20 ENCOUNTER — Encounter (HOSPITAL_BASED_OUTPATIENT_CLINIC_OR_DEPARTMENT_OTHER): Payer: Self-pay | Admitting: Urology

## 2022-07-20 ENCOUNTER — Emergency Department (HOSPITAL_BASED_OUTPATIENT_CLINIC_OR_DEPARTMENT_OTHER): Payer: Medicaid Other

## 2022-07-20 DIAGNOSIS — J069 Acute upper respiratory infection, unspecified: Secondary | ICD-10-CM | POA: Diagnosis not present

## 2022-07-20 DIAGNOSIS — Z20822 Contact with and (suspected) exposure to covid-19: Secondary | ICD-10-CM | POA: Insufficient documentation

## 2022-07-20 DIAGNOSIS — R059 Cough, unspecified: Secondary | ICD-10-CM | POA: Diagnosis present

## 2022-07-20 LAB — URINALYSIS, ROUTINE W REFLEX MICROSCOPIC
Bilirubin Urine: NEGATIVE
Glucose, UA: NEGATIVE mg/dL
Hgb urine dipstick: NEGATIVE
Ketones, ur: NEGATIVE mg/dL
Leukocytes,Ua: NEGATIVE
Nitrite: NEGATIVE
Protein, ur: 100 mg/dL — AB
Specific Gravity, Urine: >=1.03 (ref 1.005–1.030)
pH: 5.5 (ref 5.0–8.0)

## 2022-07-20 LAB — RESP PANEL BY RT-PCR (RSV, FLU A&B, COVID)  RVPGX2
Influenza A by PCR: POSITIVE — AB
Influenza B by PCR: NEGATIVE
Resp Syncytial Virus by PCR: NEGATIVE
SARS Coronavirus 2 by RT PCR: NEGATIVE

## 2022-07-20 LAB — PREGNANCY, URINE: Preg Test, Ur: NEGATIVE

## 2022-07-20 LAB — URINALYSIS, MICROSCOPIC (REFLEX)

## 2022-07-20 NOTE — ED Provider Notes (Signed)
[facility] EMERGENCY DEPARTMENT Provider Note   CSN: [id number] Arrival date & time: 07/20/22  1903     History  Chief Complaint  Patient presents with   Cough   Dysuria    Jillian Simpson is a 26 y.o. female.  Viral type symptoms.  Symptoms started today.  Nothing makes it worse or better.  Denies any nausea vomiting diarrhea.  No sick contacts.  Maybe some pain with urination as well.  No abdominal pain.  No significant medical problems.  The history is provided by the patient.       Home Medications Prior to Admission medications   Medication Sig Start Date End Date Taking? Authorizing Provider  acetaminophen (TYLENOL) 500 MG tablet Take 1,000 mg by mouth every 6 (six) hours as needed for mild pain or headache.    [provider]  dicyclomine (BENTYL) 20 MG tablet Take 1 tablet (20 mg total) by mouth 2 (two) times daily as needed for spasms. 11/17/21   Sloan Leiter, DO  famotidine (PEPCID) 20 MG tablet Take 1 tablet (20 mg total) by mouth daily. 06/16/22   Elayne Snare K, DO  ibuprofen (ADVIL,MOTRIN) 600 MG tablet Take 1 tablet (600 mg total) by mouth every 6 (six) hours as needed. 11/24/14   Leftwich-Kirby, Wilmer Floor, CNM  metroNIDAZOLE (FLAGYL) 500 MG tablet Take 500 mg by mouth 2 (two) times daily. 11/22/14   [provider]  ondansetron (ZOFRAN) 4 MG tablet Take 1 tablet (4 mg total) by mouth every 6 (six) hours. 04/29/22   Prosperi, Christian H, PA-C  oxybutynin (DITROPAN XL) 10 MG 24 hr tablet Take 1 tablet (10 mg total) by mouth at bedtime. 04/29/22   Prosperi, Christian H, PA-C  oxyCODONE-acetaminophen (PERCOCET/ROXICET) 5-325 MG per tablet Take 1-2 tablets by mouth every 6 (six) hours as needed. 11/24/14   Leftwich-Kirby, Wilmer Floor, CNM  pantoprazole (PROTONIX) 20 MG tablet Take 1 tablet (20 mg total) by mouth daily for 14 days. 11/17/21 12/01/21  Sloan Leiter, DO  sucralfate (CARAFATE) 1 g tablet Take 1 tablet (1 g total) by mouth 4 (four)  times daily -  with meals and at bedtime for 5 days. 11/17/21 11/22/21  Sloan Leiter, DO      Allergies    Patient has no known allergies.    Review of Systems   Review of Systems  Physical Exam Updated Vital Signs BP 98/69 (BP Location: Right Arm)   Pulse (!) 115   Temp 99.6 F (37.6 C) (Oral)   Resp 18   Ht 4\' 10"  (1.473 m)   Wt 51.7 kg   LMP 07/13/2022 (Exact Date)   SpO2 99%   BMI 23.82 kg/m  Physical Exam Vitals and nursing note reviewed.  Constitutional:      General: She is not in acute distress.    Appearance: She is well-developed.  HENT:     Head: Normocephalic and atraumatic.     Nose: Nose normal.     Mouth/Throat:     Mouth: Mucous membranes are moist.  Eyes:     Extraocular Movements: Extraocular movements intact.     Conjunctiva/sclera: Conjunctivae normal.     Pupils: Pupils are equal, round, and reactive to light.  Cardiovascular:     Rate and Rhythm: Normal rate and regular rhythm.     Pulses: Normal pulses.     Heart sounds: Normal heart sounds. No murmur heard. Pulmonary:     Effort: Pulmonary effort is normal. No  respiratory distress.     Breath sounds: Normal breath sounds.  Abdominal:     Palpations: Abdomen is soft.     Tenderness: There is no abdominal tenderness.  Musculoskeletal:        General: No swelling.     Cervical back: Neck supple.  Skin:    General: Skin is warm and dry.     Capillary Refill: Capillary refill takes less than 2 seconds.  Neurological:     General: No focal deficit present.     Mental Status: She is alert and oriented to person, place, and time.     Cranial Nerves: No cranial nerve deficit.     Sensory: No sensory deficit.     Motor: No weakness.     Coordination: Coordination normal.  Psychiatric:        Mood and Affect: Mood normal.     ED Results / Procedures / Treatments   Labs (all labs ordered are listed, but only abnormal results are displayed) Labs Reviewed  RESP PANEL BY RT-PCR (RSV, FLU  A&B, COVID)  RVPGX2 - Abnormal; Notable for the following components:      Result Value   Influenza A by PCR POSITIVE (*)    All other components within normal limits  URINALYSIS, ROUTINE W REFLEX MICROSCOPIC - Abnormal; Notable for the following components:   Protein, ur 100 (*)    All other components within normal limits  URINALYSIS, MICROSCOPIC (REFLEX) - Abnormal; Notable for the following components:   Bacteria, UA FEW (*)    All other components within normal limits  PREGNANCY, URINE    EKG None  Radiology DG Chest Portable 1 View  Result Date: 07/20/2022 CLINICAL DATA:  Cough. Pt sates positive COVID 2 weeks ago, states pain with cough in back Also states concern for UTI with nausea EXAM: PORTABLE CHEST 1 VIEW COMPARISON:  Chest x-ray 06/16/2022. FINDINGS: The heart and mediastinal contours are within normal limits. No focal consolidation. No pulmonary edema. No pleural effusion. No pneumothorax. No acute osseous abnormality. IMPRESSION: No active disease. Electronically Signed   By: Tish Frederickson M.D.   On: 07/20/2022 20:08    Procedures Procedures    Medications Ordered in ED Medications - No data to display  ED Course/ Medical Decision Making/ A&P                           Medical Decision Making Amount and/or Complexity of Data Reviewed Labs: ordered. Radiology: ordered.   Jillian Simpson is here with viral type symptoms.  No significant medical history.  Positive for influenza A.  Was having some urinary symptoms but no evidence of UTI.  Pregnancy test negative.  She is well-appearing.  Recommend Tylenol ibuprofen.  Discharged in good condition.  This chart was dictated using voice recognition software.  Despite best efforts to proofread,  errors can occur which can change the documentation meaning.         Final Clinical Impression(s) / ED Diagnoses Final diagnoses:  Upper respiratory tract infection, unspecified type    Rx / DC Orders ED  Discharge Orders     None         Virgina Norfolk, DO 07/20/22 2042

## 2022-07-20 NOTE — ED Triage Notes (Signed)
Pt sates positive COVID 2 weeks ago, states pain with cough in back Also states concern for UTI with nausea   H/o kidney stones

## 2022-07-25 ENCOUNTER — Encounter (HOSPITAL_BASED_OUTPATIENT_CLINIC_OR_DEPARTMENT_OTHER): Payer: Self-pay | Admitting: Emergency Medicine

## 2022-07-25 ENCOUNTER — Other Ambulatory Visit: Payer: Self-pay

## 2022-07-25 ENCOUNTER — Emergency Department (HOSPITAL_BASED_OUTPATIENT_CLINIC_OR_DEPARTMENT_OTHER): Payer: Medicaid Other

## 2022-07-25 ENCOUNTER — Emergency Department (HOSPITAL_BASED_OUTPATIENT_CLINIC_OR_DEPARTMENT_OTHER)
Admission: EM | Admit: 2022-07-25 | Discharge: 2022-07-26 | Disposition: A | Discharge status: Home / Self Care | Payer: Medicaid Other | Attending: Emergency Medicine | Admitting: Emergency Medicine

## 2022-07-25 DIAGNOSIS — J069 Acute upper respiratory infection, unspecified: Secondary | ICD-10-CM

## 2022-07-25 DIAGNOSIS — R059 Cough, unspecified: Secondary | ICD-10-CM | POA: Diagnosis present

## 2022-07-25 DIAGNOSIS — Z1152 Encounter for screening for COVID-19: Secondary | ICD-10-CM | POA: Diagnosis not present

## 2022-07-25 DIAGNOSIS — R110 Nausea: Secondary | ICD-10-CM | POA: Diagnosis not present

## 2022-07-25 LAB — RESP PANEL BY RT-PCR (RSV, FLU A&B, COVID)  RVPGX2
Influenza A by PCR: NEGATIVE
Influenza B by PCR: NEGATIVE
Resp Syncytial Virus by PCR: NEGATIVE
SARS Coronavirus 2 by RT PCR: NEGATIVE

## 2022-07-25 MED ORDER — PREDNISONE 50 MG PO TABS
60.0000 mg | ORAL_TABLET | Freq: Once | ORAL | Status: AC
  Administered 2022-07-26: 60 mg via ORAL
  Filled 2022-07-25: qty 1

## 2022-07-25 MED ORDER — PREDNISONE 50 MG PO TABS: 50.0000 mg | ORAL_TABLET | Freq: Every day | ORAL | 0 refills | Status: AC

## 2022-07-25 MED ORDER — ALBUTEROL SULFATE HFA 108 (90 BASE) MCG/ACT IN AERS: 2.0000 | INHALATION_SPRAY | RESPIRATORY_TRACT | 0 refills | Status: DC | PRN

## 2022-07-25 MED ORDER — IPRATROPIUM-ALBUTEROL 0.5-2.5 (3) MG/3ML IN SOLN
3.0000 mL | Freq: Once | RESPIRATORY_TRACT | Status: AC
  Administered 2022-07-25: 3 mL via RESPIRATORY_TRACT
  Filled 2022-07-25: qty 3

## 2022-07-25 MED ORDER — ONDANSETRON 8 MG PO TBDP: 8.0000 mg | ORAL_TABLET | Freq: Three times a day (TID) | ORAL | 0 refills | Status: AC | PRN

## 2022-07-25 MED ORDER — ONDANSETRON 4 MG PO TBDP
8.0000 mg | ORAL_TABLET | Freq: Once | ORAL | Status: AC
  Administered 2022-07-25: 8 mg via ORAL
  Filled 2022-07-25: qty 2

## 2022-07-25 NOTE — ED Provider Notes (Signed)
[facility] Provider Note   CSN: [id number] Arrival date & time: 07/25/22  1945     History  Chief Complaint  Patient presents with   Nausea    Jillian Simpson is a 27 y.o. female.  The history is provided by the patient.  She states that she had influenza a week ago and has had a persistent cough since then.  Today, the cough started to have some purulence with some yellow and brownish.  She has felt hot and cold with some chills and sweats since she came down with influenza and this has not changed.  Today, she has had nausea without vomiting.  She denies arthralgias or myalgias.  She does feel slightly short of breath.   Home Medications Prior to Admission medications   Medication Sig Start Date End Date Taking? Authorizing Provider  acetaminophen (TYLENOL) 500 MG tablet Take 1,000 mg by mouth every 6 (six) hours as needed for mild pain or headache.    [provider]  dicyclomine (BENTYL) 20 MG tablet Take 1 tablet (20 mg total) by mouth 2 (two) times daily as needed for spasms. 11/17/21   Jeanell Sparrow, DO  famotidine (PEPCID) 20 MG tablet Take 1 tablet (20 mg total) by mouth daily. 06/16/22   Leanord Asal K, DO  ibuprofen (ADVIL,MOTRIN) 600 MG tablet Take 1 tablet (600 mg total) by mouth every 6 (six) hours as needed. 11/24/14   Leftwich-Kirby, Kathie Dike, CNM  metroNIDAZOLE (FLAGYL) 500 MG tablet Take 500 mg by mouth 2 (two) times daily. 11/22/14   [provider]  ondansetron (ZOFRAN) 4 MG tablet Take 1 tablet (4 mg total) by mouth every 6 (six) hours. 04/29/22   Prosperi, Christian H, PA-C  oxybutynin (DITROPAN XL) 10 MG 24 hr tablet Take 1 tablet (10 mg total) by mouth at bedtime. 04/29/22   Prosperi, Christian H, PA-C  oxyCODONE-acetaminophen (PERCOCET/ROXICET) 5-325 MG per tablet Take 1-2 tablets by mouth every 6 (six) hours as needed. 11/24/14   Leftwich-Kirby, Kathie Dike, CNM  pantoprazole (PROTONIX) 20 MG tablet Take 1 tablet (20  mg total) by mouth daily for 14 days. 11/17/21 12/01/21  Jeanell Sparrow, DO  sucralfate (CARAFATE) 1 g tablet Take 1 tablet (1 g total) by mouth 4 (four) times daily -  with meals and at bedtime for 5 days. 11/17/21 11/22/21  Jeanell Sparrow, DO      Allergies    Patient has no known allergies.    Review of Systems   Review of Systems  All other systems reviewed and are negative.   Physical Exam Updated Vital Signs BP 116/79 (BP Location: Right Arm)   Pulse 74   Temp 98.4 F (36.9 C)   Resp 20   Ht 4\' 11"  (1.499 m)   Wt 58.1 kg   LMP 07/13/2022 (Exact Date)   SpO2 100%   BMI 25.85 kg/m  Physical Exam Vitals and nursing note reviewed.   26 year old female, resting comfortably and in no acute distress. Vital signs are normal. Oxygen saturation is 100%, which is normal. Head is normocephalic and atraumatic. PERRLA, EOMI. Oropharynx is clear. Neck is nontender and supple without adenopathy or JVD. Back is nontender and there is no CVA tenderness. Lungs are clear without rales, wheezes, or rhonchi.  There is a slightly prolonged exhalation phase. Chest is nontender. Heart has regular rate and rhythm without murmur. Abdomen is soft, flat, nontender . Extremities have no cyanosis or edema, full  range of motion is present. Skin is warm and dry without rash. Neurologic: Mental status is normal, cranial nerves are intact, moves all extremities equally.  ED Results / Procedures / Treatments   Labs (all labs ordered are listed, but only abnormal results are displayed) Labs Reviewed  RESP PANEL BY RT-PCR (RSV, FLU A&B, COVID)  RVPGX2   Radiology DG Chest 2 View  Result Date: 07/25/2022 CLINICAL DATA:  Cough EXAM: CHEST - 2 VIEW COMPARISON:  07/20/2022 FINDINGS: The heart size and mediastinal contours are within normal limits. Both lungs are clear. The visualized skeletal structures are unremarkable. IMPRESSION: No active cardiopulmonary disease. Electronically Signed   By: Donavan Foil  M.D.   On: 07/25/2022 23:29    Procedures Procedures    Medications Ordered in ED Medications  predniSONE (DELTASONE) tablet 60 mg (has no administration in time range)  ipratropium-albuterol (DUONEB) 0.5-2.5 (3) MG/3ML nebulizer solution 3 mL (3 mLs Nebulization Given 07/25/22 2337)  ondansetron (ZOFRAN-ODT) disintegrating tablet 8 mg (8 mg Oral Given 07/25/22 2323)    ED Course/ Medical Decision Making/ A&P                           Medical Decision Making Amount and/or Complexity of Data Reviewed Radiology: ordered.  Risk Prescription drug management.   Persistent cough following viral respiratory infection which apparently was influenza.  I have reviewed and interpreted her laboratory test here in my interpretation is negative PCR for influenza, COVID-19, RSV.  I am concerned about possibility of superimposed pneumonia, so I have ordered a chest x-ray.  She is also shows subtle signs of bronchospasm and I have ordered a nebulizer treatment with albuterol and ipratropium.  I have ordered ondansetron oral dissolving tablet for her nausea.  Chest x-ray shows no evidence of pneumonia.  I have independently viewed the images, and agree with radiologist's interpretation.  She feels much better following albuterol with ipratropium via nebulizer.  I have ordered a dose of prednisone and I am discharging her with prescriptions for prednisone, albuterol inhaler, ondansetron oral dissolving tablet.  Final Clinical Impression(s) / ED Diagnoses Final diagnoses:  Viral URI with cough  Nausea    Rx / DC Orders ED Discharge Orders          Ordered    predniSONE (DELTASONE) 50 MG tablet  Daily        07/25/22 2348    albuterol (VENTOLIN HFA) 108 (90 Base) MCG/ACT inhaler  Every 4 hours PRN        07/25/22 2348    ondansetron (ZOFRAN-ODT) 8 MG disintegrating tablet  Every 8 hours PRN        07/25/22 4696              Delora Fuel, MD 29/52/84 2351

## 2022-07-25 NOTE — ED Triage Notes (Signed)
Pt c/o nausea today; coughed up some mucous with brownish streaks  (per picture); sts she has chills; recently had Covid and then flu; NAD in triage

## 2022-07-25 NOTE — Discharge Instructions (Signed)
Drink plenty of fluids.  Take acetaminophen and/your ibuprofen as needed for fever or aching.  Use the inhaler every 4 hours as needed for cough.  When you use the inhaler, use 2 puffs at a time.

## 2022-12-04 ENCOUNTER — Emergency Department (HOSPITAL_BASED_OUTPATIENT_CLINIC_OR_DEPARTMENT_OTHER): Payer: Medicaid Other

## 2022-12-04 ENCOUNTER — Encounter (HOSPITAL_BASED_OUTPATIENT_CLINIC_OR_DEPARTMENT_OTHER): Payer: Self-pay

## 2022-12-04 ENCOUNTER — Other Ambulatory Visit: Payer: Self-pay

## 2022-12-04 ENCOUNTER — Emergency Department (HOSPITAL_BASED_OUTPATIENT_CLINIC_OR_DEPARTMENT_OTHER)
Admission: EM | Admit: 2022-12-04 | Discharge: 2022-12-04 | Disposition: A | Discharge status: Home / Self Care | Payer: Medicaid Other | Attending: Emergency Medicine | Admitting: Emergency Medicine

## 2022-12-04 DIAGNOSIS — Z1152 Encounter for screening for COVID-19: Secondary | ICD-10-CM | POA: Diagnosis not present

## 2022-12-04 DIAGNOSIS — J069 Acute upper respiratory infection, unspecified: Secondary | ICD-10-CM | POA: Insufficient documentation

## 2022-12-04 DIAGNOSIS — R059 Cough, unspecified: Secondary | ICD-10-CM | POA: Diagnosis present

## 2022-12-04 LAB — RESP PANEL BY RT-PCR (RSV, FLU A&B, COVID)  RVPGX2
Influenza A by PCR: NEGATIVE
Influenza B by PCR: NEGATIVE
Resp Syncytial Virus by PCR: NEGATIVE
SARS Coronavirus 2 by RT PCR: NEGATIVE

## 2022-12-04 NOTE — Discharge Instructions (Signed)
Use the Zyrtec and Mucinex as discussed.  Return to the emergency room if you have any worsening symptoms.

## 2022-12-04 NOTE — ED Provider Notes (Signed)
[facility] Provider Note   CSN: [id number] Arrival date & time: 12/04/22  1415     History  Chief Complaint  Patient presents with   Chest Pain   Cough    Jillian Simpson is a 27 y.o. female.  Patient is a 27 year old female who presents with a 2-day history of runny nose congestion and some chest congestion.  She feels like there is some gurgling in her chest.  She felt a little short of breath last night but none today.  She denies any known fevers.  No vomiting or diarrhea.  She had bronchitis in January and says this feels similar.  No prior history of asthma or other lung disease.  She did use an inhaler with her prior episode of bronchitis so she did try that last night and it seemed to help a little bit.       Home Medications Prior to Admission medications   Medication Sig Start Date End Date Taking? Authorizing Provider  acetaminophen (TYLENOL) 500 MG tablet Take 1,000 mg by mouth every 6 (six) hours as needed for mild pain or headache.    [provider]  albuterol (VENTOLIN HFA) 108 (90 Base) MCG/ACT inhaler Inhale 2 puffs into the lungs every 4 (four) hours as needed for wheezing or shortness of breath (or coughing). 07/25/22   Dione Booze, MD  dicyclomine (BENTYL) 20 MG tablet Take 1 tablet (20 mg total) by mouth 2 (two) times daily as needed for spasms. 11/17/21   Sloan Leiter, DO  ibuprofen (ADVIL,MOTRIN) 600 MG tablet Take 1 tablet (600 mg total) by mouth every 6 (six) hours as needed. 11/24/14   Leftwich-Kirby, Wilmer Floor, CNM  ondansetron (ZOFRAN-ODT) 8 MG disintegrating tablet Take 1 tablet (8 mg total) by mouth every 8 (eight) hours as needed for nausea or vomiting. 07/25/22   Dione Booze, MD  oxybutynin (DITROPAN XL) 10 MG 24 hr tablet Take 1 tablet (10 mg total) by mouth at bedtime. 04/29/22   Prosperi, Christian H, PA-C  predniSONE (DELTASONE) 50 MG tablet Take 1 tablet (50 mg total) by mouth daily. 07/25/22    Dione Booze, MD      Allergies    Patient has no known allergies.    Review of Systems   Review of Systems  Constitutional:  Positive for fatigue. Negative for chills, diaphoresis and fever.  HENT:  Positive for congestion and rhinorrhea. Negative for sneezing.   Eyes: Negative.   Respiratory:  Positive for cough. Negative for chest tightness and shortness of breath.   Cardiovascular:  Negative for chest pain and leg swelling.  Gastrointestinal:  Negative for abdominal pain, blood in stool, diarrhea, nausea and vomiting.  Genitourinary:  Negative for difficulty urinating, flank pain, frequency and hematuria.  Musculoskeletal:  Negative for arthralgias and back pain.  Skin:  Negative for rash.  Neurological:  Negative for dizziness, speech difficulty, weakness, numbness and headaches.    Physical Exam Updated Vital Signs BP 99/73 (BP Location: Left Arm)   Pulse (!) 118   Temp 98 F (36.7 C)   Resp 18   Ht 4\' 11"  (1.499 m)   Wt 53.5 kg   LMP 11/01/2022   SpO2 100%   BMI 23.83 kg/m  Physical Exam Constitutional:      Appearance: She is well-developed.  HENT:     Head: Normocephalic and atraumatic.     Ears:     Comments: Clear fluid behind the TMs bilaterally  without suggestions of infection    Nose: Nose normal.  Eyes:     Pupils: Pupils are equal, round, and reactive to light.  Cardiovascular:     Rate and Rhythm: Normal rate and regular rhythm.     Heart sounds: Normal heart sounds.  Pulmonary:     Effort: Pulmonary effort is normal. No respiratory distress.     Breath sounds: Normal breath sounds. No wheezing or rales.  Chest:     Chest wall: No tenderness.  Abdominal:     General: Bowel sounds are normal.     Palpations: Abdomen is soft.     Tenderness: There is no abdominal tenderness. There is no guarding or rebound.  Musculoskeletal:        General: Normal range of motion.     Cervical back: Normal range of motion and neck supple.  Lymphadenopathy:      Cervical: No cervical adenopathy.  Skin:    General: Skin is warm and dry.     Findings: No rash.  Neurological:     Mental Status: She is alert and oriented to person, place, and time.     ED Results / Procedures / Treatments   Labs (all labs ordered are listed, but only abnormal results are displayed) Labs Reviewed  RESP PANEL BY RT-PCR (RSV, FLU A&B, COVID)  RVPGX2    EKG None  Radiology DG Chest 2 View  Result Date: 12/04/2022 CLINICAL DATA:  Chest pain. EXAM: CHEST - 2 VIEW COMPARISON:  07/25/2022. FINDINGS: Clear lungs. Normal heart size and mediastinal contours. No pleural effusion or pneumothorax. Visualized bones and upper abdomen are unremarkable. IMPRESSION: No evidence of acute cardiopulmonary disease. Electronically Signed   By: Orvan Falconer M.D.   On: 12/04/2022 14:58    Procedures Procedures    Medications Ordered in ED Medications - No data to display  ED Course/ Medical Decision Making/ A&P                             Medical Decision Making Amount and/or Complexity of Data Reviewed External Data Reviewed: notes. Labs: ordered. Decision-making details documented in ED Course. Radiology: ordered and independent interpretation performed. Decision-making details documented in ED Course. ECG/medicine tests: ordered and independent interpretation performed. Decision-making details documented in ED Course.  Risk OTC drugs. Decision regarding hospitalization.   Patient is a 27 year old female who presents with URI symptoms since yesterday.  She was little tachycardic on arrival but on my exam, her heart rate was in the 80s.  Chest x-ray two-view was obtained which was interpreted by me and confirmed by the radiologist to show no evidence of pneumonia.  Her COVID/flu test was negative.  She overall is well-appearing.  Her lungs are clear without wheezing.  No increased work of breathing.  No hypoxia.  Suspect that she has a viral infection.  She was advised  on symptomatic care.  Return precautions were given.  Final Clinical Impression(s) / ED Diagnoses Final diagnoses:  Viral upper respiratory tract infection    Rx / DC Orders ED Discharge Orders     None         Rolan Bucco, MD 12/04/22 1717

## 2022-12-04 NOTE — ED Triage Notes (Signed)
Pt c/o central chest tightness, intermittent R sided chest pain, and congested cough since last night.  Pain score 7/10. Pt reports taking OTC medication yesterday w/o relief.  Pt reports similar symptoms in January and she was diagnosed w/ bronchitis.

## 2022-12-04 NOTE — ED Notes (Signed)
Pt A&OX4 ambulatory at d/c with independent steady gait. Pt verbalized understanding of d/c instruction and follow up care.

## 2024-01-21 IMAGING — US US ABDOMEN LIMITED
1 series · 14 of 25 positions shown · non-contrast
Comparison: None.

CLINICAL DATA: Sludge in gallbladder

EXAM:
ULTRASOUND ABDOMEN LIMITED RIGHT UPPER QUADRANT

[Series 1: us abdomen limited · 14 of 44 slices shown]
[im 1/44]
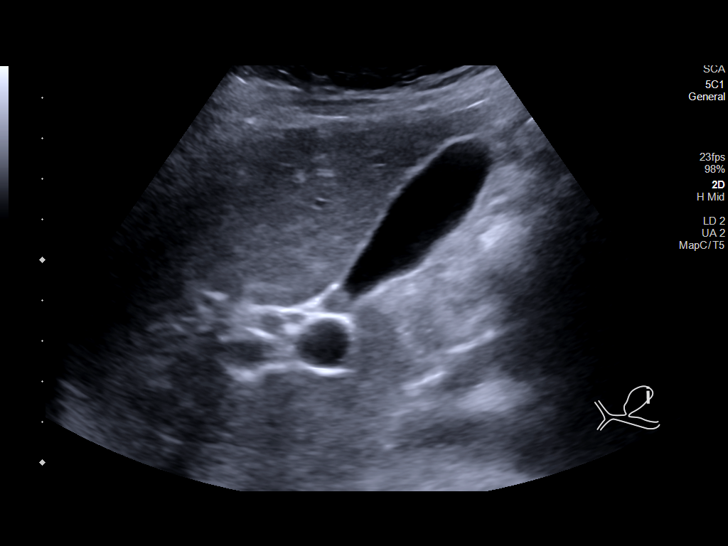
[im 4/44]
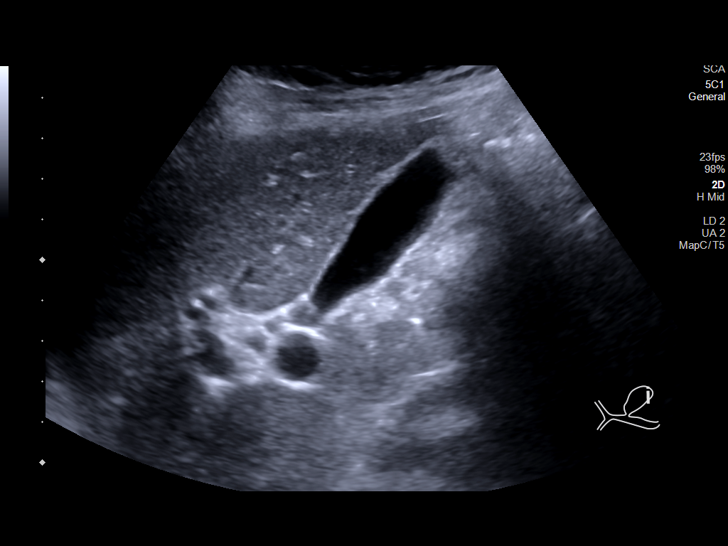
[im 8/44]
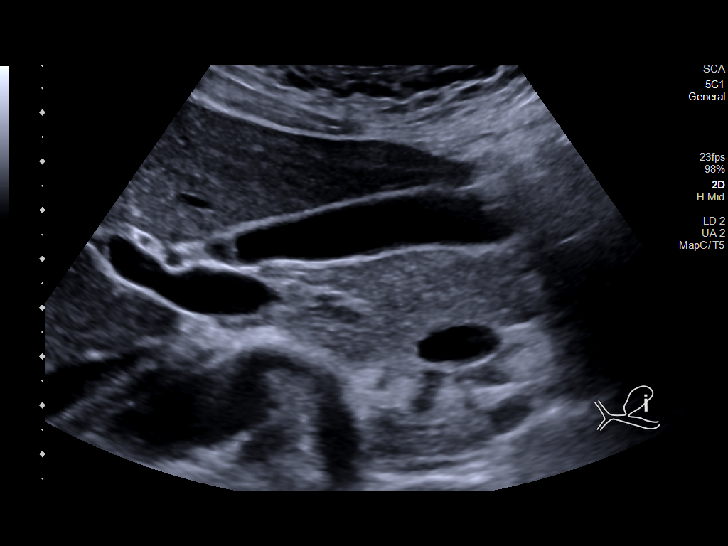
[im 11/44]
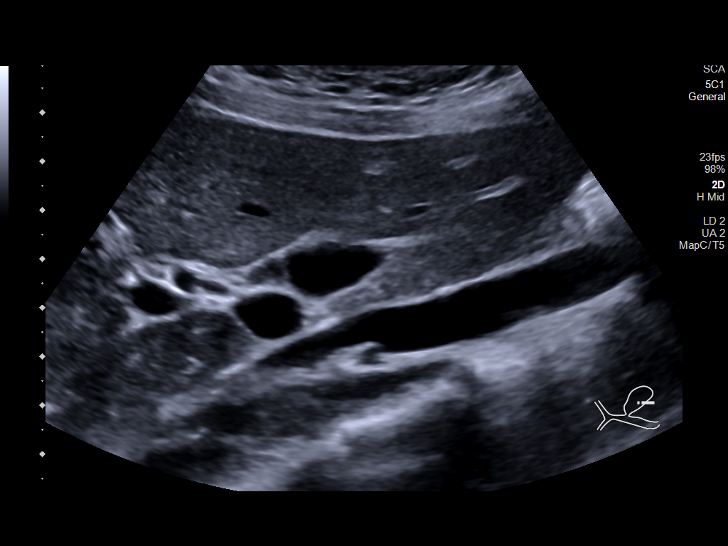
[im 15/44]
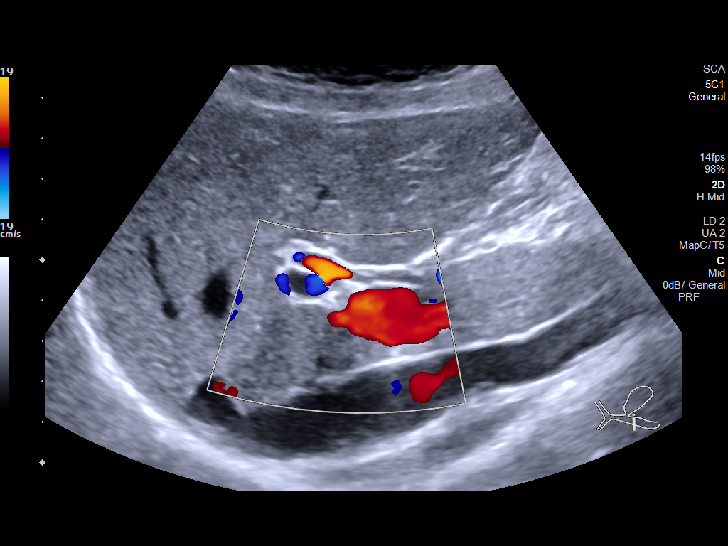
[im 17/44]
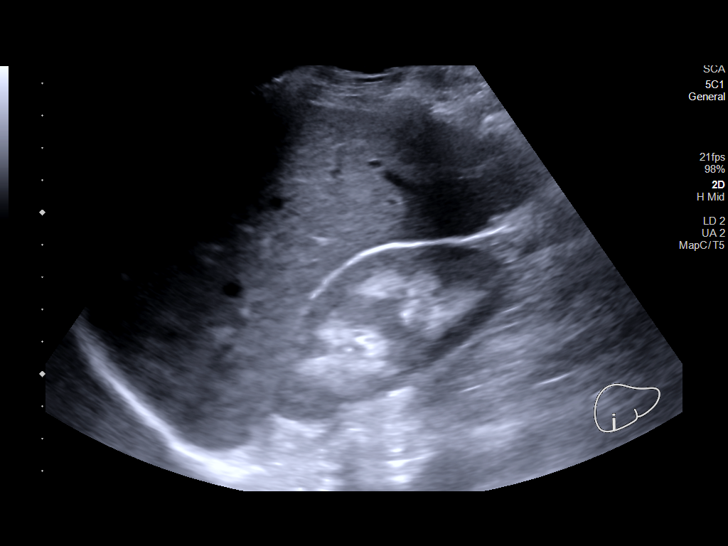
[im 20/44]
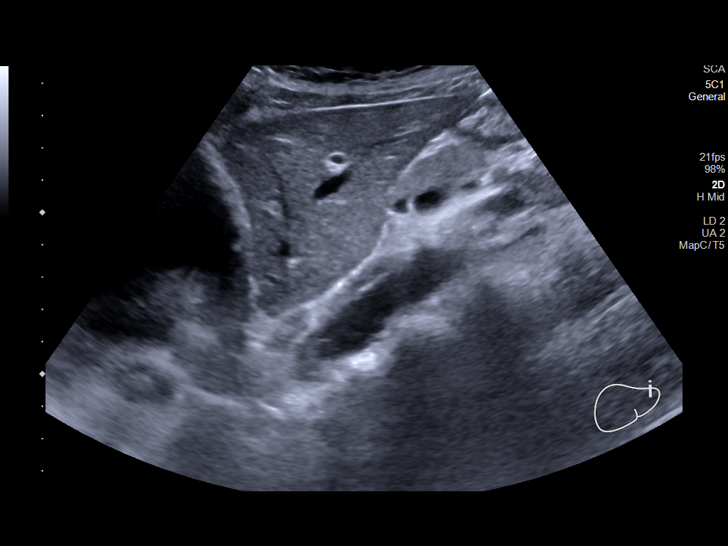
[im 24/44]
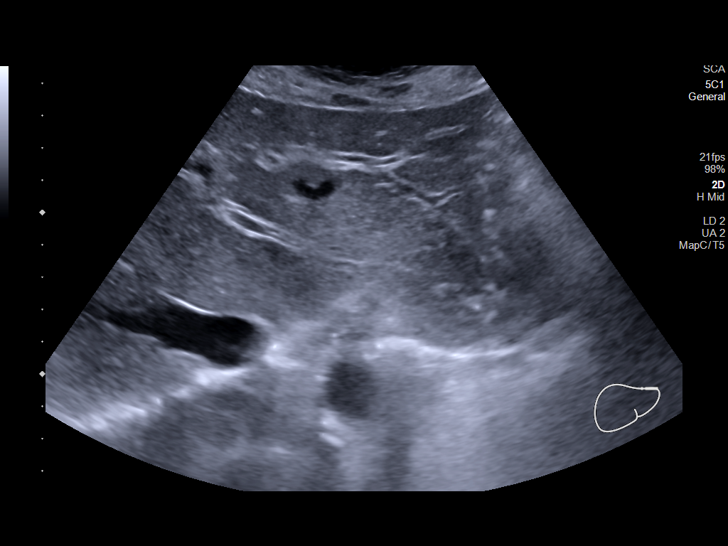
[im 27/44]
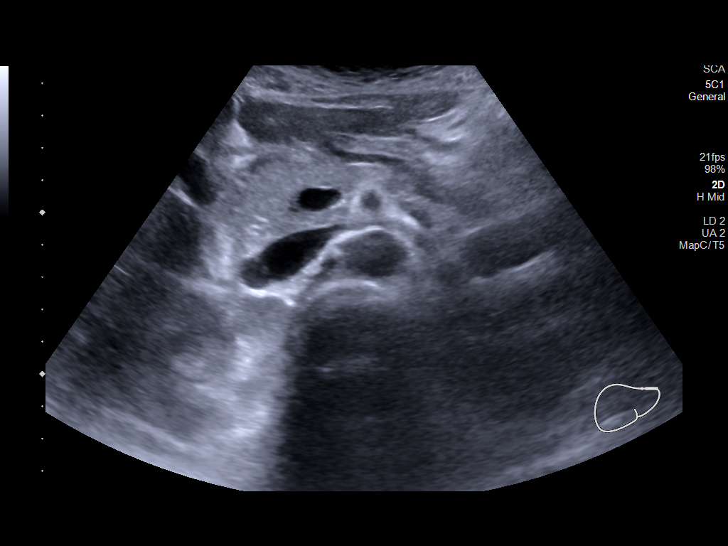
[im 29/44]
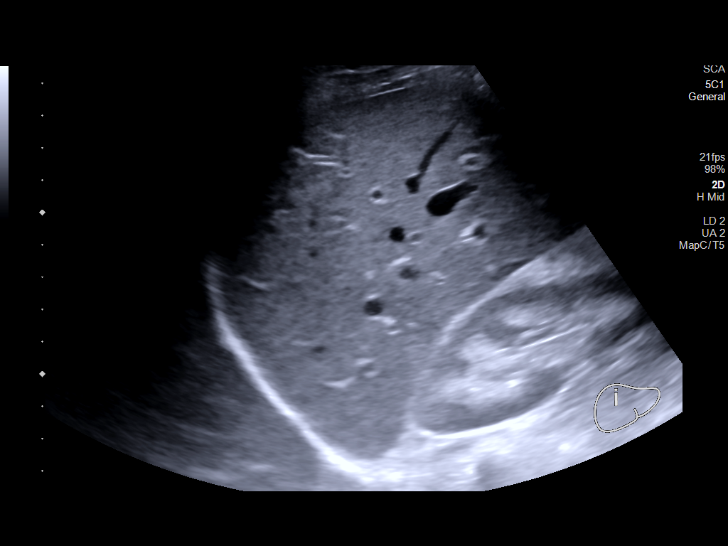
[im 33/44]
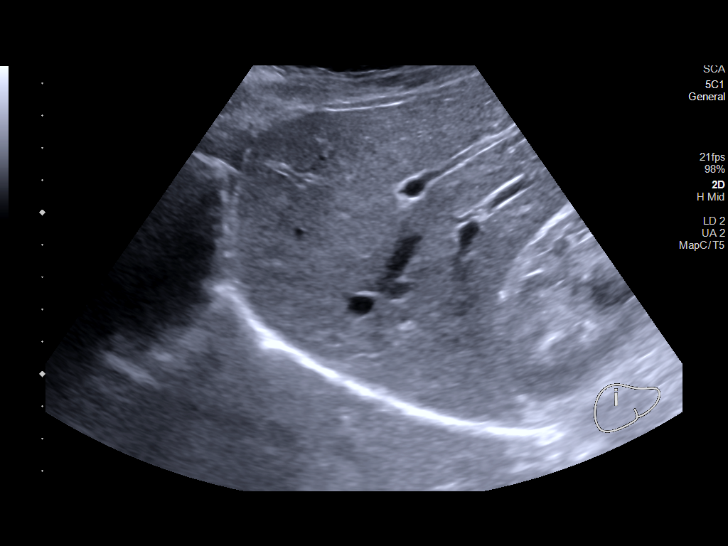
[im 36/44]
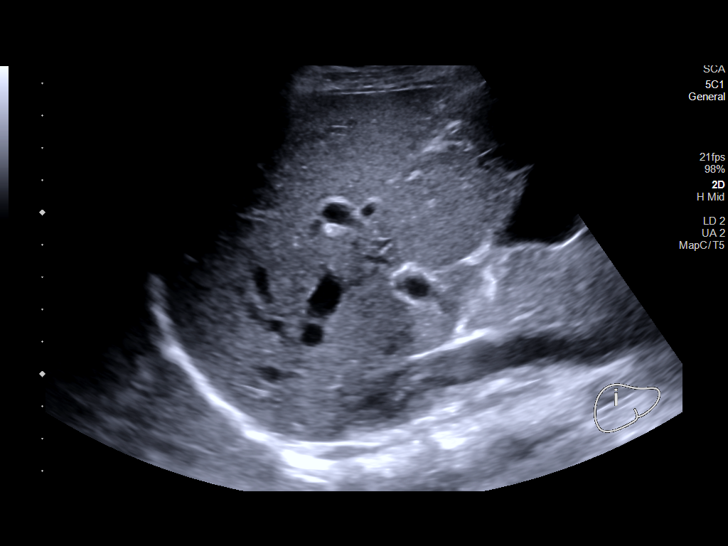
[im 40/44]
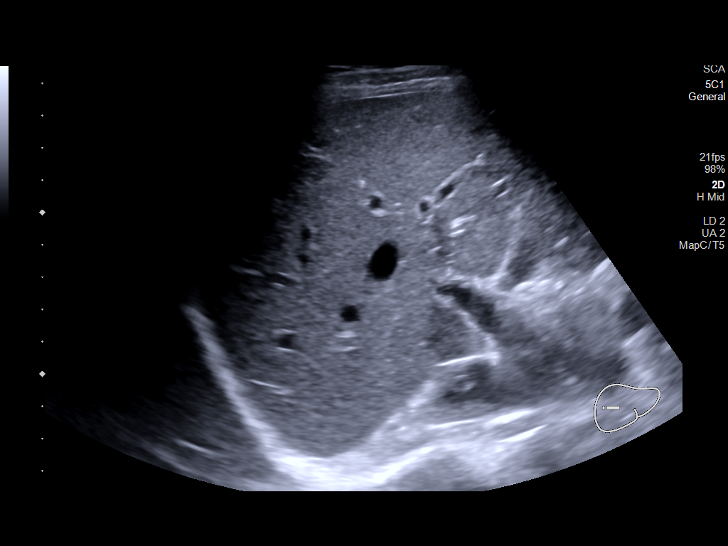
[im 44/44]
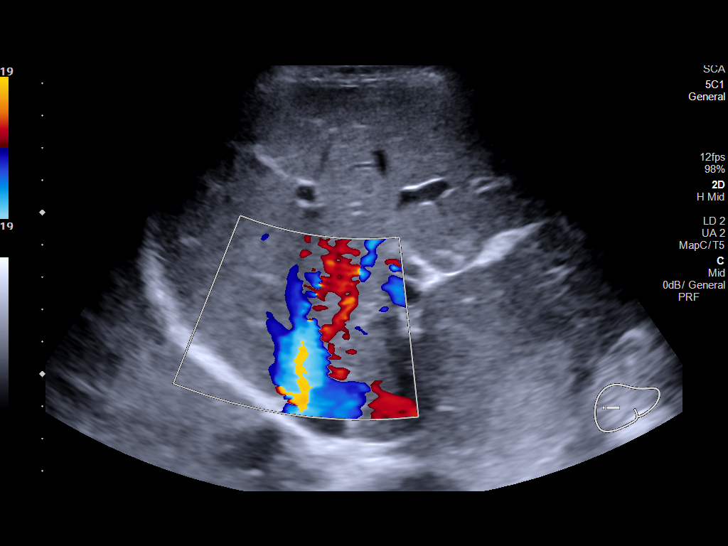

[14 of 25 positions shown; findings below may reference images not displayed]

FINDINGS: Gallbladder:

No gallstones or wall thickening visualized. No sludge visualized.
No sonographic Murphy sign noted by sonographer.

Common bile duct:

Diameter: 1.8 mm

Liver:

No focal lesion identified. Within normal limits in parenchymal
echogenicity. Portal vein is patent on color Doppler imaging with
normal direction of blood flow towards the liver.

Other: None.
IMPRESSION: Normal sonographic appearance of the gallbladder.

## 2024-01-26 ENCOUNTER — Encounter (HOSPITAL_BASED_OUTPATIENT_CLINIC_OR_DEPARTMENT_OTHER): Payer: Self-pay

## 2024-01-26 ENCOUNTER — Other Ambulatory Visit: Payer: Self-pay

## 2024-01-26 ENCOUNTER — Emergency Department (HOSPITAL_BASED_OUTPATIENT_CLINIC_OR_DEPARTMENT_OTHER)

## 2024-01-26 ENCOUNTER — Emergency Department (HOSPITAL_BASED_OUTPATIENT_CLINIC_OR_DEPARTMENT_OTHER): Admission: EM | Admit: 2024-01-26 | Discharge: 2024-01-26 | Disposition: A | Discharge status: Home / Self Care

## 2024-01-26 DIAGNOSIS — R1030 Lower abdominal pain, unspecified: Secondary | ICD-10-CM | POA: Diagnosis present

## 2024-01-26 DIAGNOSIS — N3000 Acute cystitis without hematuria: Secondary | ICD-10-CM | POA: Insufficient documentation

## 2024-01-26 HISTORY — DX: Calculus of kidney: N20.0

## 2024-01-26 LAB — URINALYSIS, MICROSCOPIC (REFLEX): WBC, UA: >50 WBC/hpf (ref 0–5)

## 2024-01-26 LAB — BASIC METABOLIC PANEL WITH GFR
Anion gap: 11 (ref 5–15)
BUN: 14 mg/dL (ref 6–20)
CO2: 24 mmol/L (ref 22–32)
Calcium: 9.3 mg/dL (ref 8.9–10.3)
Chloride: 105 mmol/L (ref 98–111)
Creatinine, Ser: 0.68 mg/dL (ref 0.44–1.00)
GFR, Estimated: >60 mL/min (ref >60)
Glucose, Bld: 93 mg/dL (ref 70–99)
Potassium: 4.3 mmol/L (ref 3.5–5.1)
Sodium: 139 mmol/L (ref 135–145)

## 2024-01-26 LAB — URINALYSIS, ROUTINE W REFLEX MICROSCOPIC
Bilirubin Urine: NEGATIVE
Glucose, UA: NEGATIVE mg/dL
Ketones, ur: NEGATIVE mg/dL
Nitrite: NEGATIVE
Protein, ur: NEGATIVE mg/dL
Specific Gravity, Urine: 1.015 (ref 1.005–1.030)
pH: 6.5 (ref 5.0–8.0)

## 2024-01-26 LAB — CBC WITH DIFFERENTIAL/PLATELET
Abs Immature Granulocytes: 0.01 K/uL (ref 0.00–0.07)
Basophils Absolute: 0 K/uL (ref 0.0–0.1)
Basophils Relative: 1 %
Eosinophils Absolute: 0.2 K/uL (ref 0.0–0.5)
Eosinophils Relative: 3 %
HCT: 41.3 % (ref 36.0–46.0)
Hemoglobin: 13.7 g/dL (ref 12.0–15.0)
Immature Granulocytes: 0 %
Lymphocytes Relative: 32 %
Lymphs Abs: 1.9 K/uL (ref 0.7–4.0)
MCH: 28 pg (ref 26.0–34.0)
MCHC: 33.2 g/dL (ref 30.0–36.0)
MCV: 84.5 fL (ref 80.0–100.0)
Monocytes Absolute: 0.4 K/uL (ref 0.1–1.0)
Monocytes Relative: 7 %
Neutro Abs: 3.5 K/uL (ref 1.7–7.7)
Neutrophils Relative %: 57 %
Platelets: 268 K/uL (ref 150–400)
RBC: 4.89 MIL/uL (ref 3.87–5.11)
RDW: 13.3 % (ref 11.5–15.5)
WBC: 6 K/uL (ref 4.0–10.5)
nRBC: 0 % (ref 0.0–0.2)

## 2024-01-26 LAB — PREGNANCY, URINE: Preg Test, Ur: NEGATIVE

## 2024-01-26 MED ORDER — KETOROLAC TROMETHAMINE 15 MG/ML IJ SOLN
15.0000 mg | Freq: Once | INTRAMUSCULAR | Status: AC
  Administered 2024-01-26: 15 mg via INTRAVENOUS
  Filled 2024-01-26: qty 1

## 2024-01-26 MED ORDER — CEPHALEXIN 500 MG PO CAPS: 500.0000 mg | ORAL_CAPSULE | Freq: Four times a day (QID) | ORAL | 0 refills | Status: AC

## 2024-01-26 MED ORDER — ONDANSETRON HCL 4 MG PO TABS: 4.0000 mg | ORAL_TABLET | Freq: Four times a day (QID) | ORAL | 0 refills | Status: AC

## 2024-01-26 NOTE — ED Provider Notes (Signed)
 [facility] Provider Note   CSN: [id number] Arrival date & time: 01/26/24  9176     Patient presents with: Dysuria   Jillian Simpson is a 28 y.o. female.   28 year old female presenting emergency department for right flank pain and suprapubic discomfort.  Reports dysuria/frequency for the past couple weeks.  This morning with nausea vomiting and some pain in the right flank.  Feels similar to prior kidney stones.  Was told by her OB/GYN that she did not have a urinary tract infection sick/23.   Dysuria      Prior to Admission medications   Medication Sig Start Date End Date Taking? Authorizing Provider  cephALEXin  (KEFLEX ) 500 MG capsule Take 1 capsule (500 mg total) by mouth 4 (four) times daily. 01/26/24  Yes Neysa Caron PARAS, DO  ondansetron  (ZOFRAN ) 4 MG tablet Take 1 tablet (4 mg total) by mouth every 6 (six) hours. 01/26/24  Yes Neysa Caron PARAS, DO  acetaminophen  (TYLENOL ) 500 MG tablet Take 1,000 mg by mouth every 6 (six) hours as needed for mild pain or headache.    [provider]  albuterol  (VENTOLIN  HFA) 108 (90 Base) MCG/ACT inhaler Inhale 2 puffs into the lungs every 4 (four) hours as needed for wheezing or shortness of breath (or coughing). 07/25/22   Raford Lenis, MD  dicyclomine  (BENTYL ) 20 MG tablet Take 1 tablet (20 mg total) by mouth 2 (two) times daily as needed for spasms. 11/17/21   Elnor Jayson LABOR, DO  ibuprofen  (ADVIL ,MOTRIN ) 600 MG tablet Take 1 tablet (600 mg total) by mouth every 6 (six) hours as needed. 11/24/14   Leftwich-Kirby, Olam LABOR, CNM  ondansetron  (ZOFRAN -ODT) 8 MG disintegrating tablet Take 1 tablet (8 mg total) by mouth every 8 (eight) hours as needed for nausea or vomiting. 07/25/22   Raford Lenis, MD  oxybutynin  (DITROPAN  XL) 10 MG 24 hr tablet Take 1 tablet (10 mg total) by mouth at bedtime. 04/29/22   Prosperi, Christian H, PA-C  predniSONE  (DELTASONE ) 50 MG tablet Take 1 tablet (50 mg total) by mouth  daily. 07/25/22   Raford Lenis, MD    Allergies: Patient has no known allergies.    Review of Systems  Genitourinary:  Positive for dysuria.    Updated Vital Signs BP 99/73 (BP Location: Right Arm)   Pulse 72   Temp 98.1 F (36.7 C)   Resp 18   Ht 4' 11 (1.499 m)   Wt 54 kg   LMP 01/16/2024 (Approximate)   SpO2 100%   BMI 24.04 kg/m   Physical Exam Vitals and nursing note reviewed.  Constitutional:      General: She is not in acute distress.    Appearance: She is not toxic-appearing.  HENT:     Head: Normocephalic.     Nose: Nose normal.  Eyes:     Conjunctiva/sclera: Conjunctivae normal.  Pulmonary:     Effort: Pulmonary effort is normal.  Abdominal:     General: Abdomen is flat. There is no distension.     Palpations: Abdomen is soft.     Tenderness: There is no abdominal tenderness. There is no guarding or rebound.  Skin:    General: Skin is warm.     Capillary Refill: Capillary refill takes less than 2 seconds.  Neurological:     Mental Status: She is alert and oriented to person, place, and time.  Psychiatric:        Mood and Affect: Mood normal.  Behavior: Behavior normal.     (all labs ordered are listed, but only abnormal results are displayed) Labs Reviewed  URINALYSIS, ROUTINE W REFLEX MICROSCOPIC - Abnormal; Notable for the following components:      Result Value   APPearance CLOUDY (*)    Hgb urine dipstick MODERATE (*)    Leukocytes,Ua LARGE (*)    All other components within normal limits  URINALYSIS, MICROSCOPIC (REFLEX) - Abnormal; Notable for the following components:   Bacteria, UA FEW (*)    All other components within normal limits  PREGNANCY, URINE  CBC WITH DIFFERENTIAL/PLATELET  BASIC METABOLIC PANEL WITH GFR    EKG: None  Radiology: CT Renal Stone Study Result Date: 01/26/2024 CLINICAL DATA:  Abdominal pain.  Flank pain.  Stone suspected EXAM: CT ABDOMEN AND PELVIS WITHOUT CONTRAST TECHNIQUE: Multidetector CT imaging of  the abdomen and pelvis was performed following the standard protocol without IV contrast. RADIATION DOSE REDUCTION: This exam was performed according to the departmental dose-optimization program which includes automated exposure control, adjustment of the mA and/or kV according to patient size and/or use of iterative reconstruction technique. COMPARISON:  None FINDINGS: Lower chest: Lung bases are clear. Hepatobiliary: No focal hepatic lesion. Normal gallbladder. No biliary duct dilatation. Common bile duct is normal. Pancreas: Pancreas is normal. No ductal dilatation. No pancreatic inflammation. Spleen: Normal spleen Adrenals/urinary tract: Adrenal glands normal. Nephrolithiasis or ureterolithiasis. There is mild medullary calcification throughout the LEFT and RIGHT kidney. The ureters and bladder normal. Stomach/Bowel: Stomach, small bowel, appendix, and cecum are normal. The colon and rectosigmoid colon are normal. Vascular/Lymphatic: Abdominal aorta is normal caliber. No periportal or retroperitoneal adenopathy. No pelvic adenopathy. Reproductive: Uterus and adnexa unremarkable. Other: No free fluid. Musculoskeletal: No aggressive osseous lesion. IMPRESSION: 1. No nephrolithiasis or ureterolithiasis. 2. Mild medullary calcinosis. 3. Normal appendix. 4. Normal uterus and ovaries. Electronically Signed   By: Jackquline Boxer M.D.   On: 01/26/2024 10:29     Procedures   Medications Ordered in the ED  ketorolac  (TORADOL ) 15 MG/ML injection 15 mg (15 mg Intravenous Given 01/26/24 0923)    Clinical Course as of 01/26/24 1501  Sun Jan 26, 2024  1034 CT Renal Stone Study MPRESSION: 1. No nephrolithiasis or ureterolithiasis. 2. Mild medullary calcinosis. 3. Normal appendix. 4. Normal uterus and ovaries.   Electronically Signed   By: Jackquline Boxer M.D.   On: 01/26/2024 10:29   [TY]  1034 Leukocytes,Ua(!): LARGE Given symptoms we will treat with antibiotics. [TY]  1034 CBC with Differential No  leukocytosis to suggest a stomach infection.  No anemia. [TY]  1035 Basic metabolic panel No metabolic derangements.  Normal kidney function. [TY]  1043 Reassuring vitals, labs and physical exam.  Appears to have a UTI.  Will discharge with antibiotics.  Follow-up with primary doctor/OB/GYN.  Agreeable to plan.  Stable for discharge. [TY]    Clinical Course User Index [TY] Neysa Caron PARAS, DO                                 Medical Decision Making This is a 28 year old female presenting emergency department with concern that she has a kidney stone or urinary tract infection.  She is afebrile nontachycardic.  Benign abdominal exam.  Per chart review does have history of kidney stones and urinary tract infections.  Will get basic labs, UA and CT renal stone study.  Will give Toradol  for pain.  See ED course  for further MDM and disposition.  Amount and/or Complexity of Data Reviewed Labs: ordered. Decision-making details documented in ED Course. Radiology: ordered and independent interpretation performed. Decision-making details documented in ED Course.  Risk Prescription drug management. Decision regarding hospitalization. Diagnosis or treatment significantly limited by social determinants of health. Risk Details: Poor health literacy      Final diagnoses:  Acute cystitis without hematuria    ED Discharge Orders          Ordered    cephALEXin  (KEFLEX ) 500 MG capsule  4 times daily        01/26/24 1044    ondansetron  (ZOFRAN ) 4 MG tablet  Every 6 hours        01/26/24 1044               Daily Crate, Caron PARAS, DO 01/26/24 1501

## 2024-01-26 NOTE — Discharge Instructions (Signed)
 Follow-up with your primary doctor.  Return if develop fevers, chills, severe pain, inability take your antibiotics due to nausea vomiting or any new or worsening symptoms that are concerning to you.

## 2024-01-26 NOTE — ED Triage Notes (Signed)
 Saw OBGYN on 6/23 for UTI/yeast infection symptoms. States urine was negative, but did have yeast infection. Still complains of feeling bloated, frequent urination, burning with urination, right flank pain.   Hx of kidney stones with similar symptoms.

## 2024-04-06 DIAGNOSIS — J40 Bronchitis, not specified as acute or chronic: Secondary | ICD-10-CM | POA: Insufficient documentation

## 2024-04-06 DIAGNOSIS — R0789 Other chest pain: Secondary | ICD-10-CM | POA: Diagnosis present

## 2024-04-07 ENCOUNTER — Other Ambulatory Visit: Payer: Self-pay

## 2024-04-07 ENCOUNTER — Emergency Department (HOSPITAL_BASED_OUTPATIENT_CLINIC_OR_DEPARTMENT_OTHER)

## 2024-04-07 ENCOUNTER — Emergency Department (HOSPITAL_BASED_OUTPATIENT_CLINIC_OR_DEPARTMENT_OTHER)
Admission: EM | Admit: 2024-04-07 | Discharge: 2024-04-07 | Disposition: A | Discharge status: Home / Self Care | Attending: Emergency Medicine | Admitting: Emergency Medicine

## 2024-04-07 ENCOUNTER — Encounter (HOSPITAL_BASED_OUTPATIENT_CLINIC_OR_DEPARTMENT_OTHER): Payer: Self-pay | Admitting: Emergency Medicine

## 2024-04-07 DIAGNOSIS — J40 Bronchitis, not specified as acute or chronic: Secondary | ICD-10-CM

## 2024-04-07 DIAGNOSIS — R0789 Other chest pain: Secondary | ICD-10-CM

## 2024-04-07 LAB — CBC
HCT: 41.8 % (ref 36.0–46.0)
Hemoglobin: 14 g/dL (ref 12.0–15.0)
MCH: 28.5 pg (ref 26.0–34.0)
MCHC: 33.5 g/dL (ref 30.0–36.0)
MCV: 85 fL (ref 80.0–100.0)
Platelets: 280 K/uL (ref 150–400)
RBC: 4.92 MIL/uL (ref 3.87–5.11)
RDW: 13.7 % (ref 11.5–15.5)
WBC: 8.2 K/uL (ref 4.0–10.5)
nRBC: 0 % (ref 0.0–0.2)

## 2024-04-07 LAB — BASIC METABOLIC PANEL WITH GFR
Anion gap: 11 (ref 5–15)
BUN: 12 mg/dL (ref 6–20)
CO2: 28 mmol/L (ref 22–32)
Calcium: 9.8 mg/dL (ref 8.9–10.3)
Chloride: 102 mmol/L (ref 98–111)
Creatinine, Ser: 0.76 mg/dL (ref 0.44–1.00)
GFR, Estimated: >60 mL/min (ref >60)
Glucose, Bld: 100 mg/dL — ABNORMAL HIGH (ref 70–99)
Potassium: 3.5 mmol/L (ref 3.5–5.1)
Sodium: 140 mmol/L (ref 135–145)

## 2024-04-07 LAB — PREGNANCY, URINE: Preg Test, Ur: NEGATIVE

## 2024-04-07 LAB — TROPONIN T, HIGH SENSITIVITY
Troponin T High Sensitivity: <15 ng/L (ref 0–19)
Troponin T High Sensitivity: <15 ng/L (ref 0–19)

## 2024-04-07 LAB — D-DIMER, QUANTITATIVE: D-Dimer, Quant: <0.27 ug{FEU}/mL (ref 0.00–0.50)

## 2024-04-07 MED ORDER — ALBUTEROL SULFATE HFA 108 (90 BASE) MCG/ACT IN AERS: 1.0000 | INHALATION_SPRAY | Freq: Four times a day (QID) | RESPIRATORY_TRACT | 0 refills | Status: AC | PRN

## 2024-04-07 MED ORDER — DOXYCYCLINE HYCLATE 100 MG PO CAPS: 100.0000 mg | ORAL_CAPSULE | Freq: Two times a day (BID) | ORAL | 0 refills | Status: AC

## 2024-04-07 MED ORDER — IPRATROPIUM-ALBUTEROL 0.5-2.5 (3) MG/3ML IN SOLN
3.0000 mL | Freq: Once | RESPIRATORY_TRACT | Status: AC
  Administered 2024-04-07: 3 mL via RESPIRATORY_TRACT
  Filled 2024-04-07: qty 3

## 2024-04-07 NOTE — Discharge Instructions (Signed)
 Your testing is reassuring.  No evidence of heart attack.  No evidence of blood clot in the lung.  Chest x-ray shows no pneumonia or fluid in your lungs.  Use the albuterol  as needed every 4 hours for difficulty breathing and take the antibiotics as prescribed.  Follow-up with your doctor.  Return to the ED with new or worsening symptoms

## 2024-04-07 NOTE — ED Provider Notes (Signed)
 [facility] Provider Note   CSN: [id number] Arrival date & time: 04/06/24  2358     Patient presents with: Chest Pain and Cough   Jillian Simpson is a 28 y.o. female.   Patient with a history of bronchitis and kidney stones here with chest heaviness and shortness of breath since yesterday.  States she has had issues on and off for several years with intermittent shortness of breath and coughing up brown mucus.  Has been seen for this multiple times without clear diagnosis.  At one point she was told she had gunk in her right lung.  She is concerned this is happening again.  She is having pain to the right side of her chest when she takes a deep breath in and feels like there is fluid in her chest.  Does feel pressure in the center of her chest and feels short of breath like she is not getting enough air.  Still having cough productive of clear mucus which she has chronically.  Does not smoke.  No history of asthma or COPD.  No fever.  No abdominal pain, nausea or vomiting.  No pain with urination or blood in the urine.  States she feels anxious like there is some fluid in her lungs and she has difficulty coughing up anything.  The history is provided by the patient.  Chest Pain Associated symptoms: cough   Associated symptoms: no abdominal pain, no dizziness, no fever, no headache, no nausea, no vomiting and no weakness   Cough Associated symptoms: chest pain   Associated symptoms: no fever, no headaches, no myalgias, no rash and no rhinorrhea        Prior to Admission medications   Medication Sig Start Date End Date Taking? Authorizing Provider  acetaminophen  (TYLENOL ) 500 MG tablet Take 1,000 mg by mouth every 6 (six) hours as needed for mild pain or headache.    [provider]  albuterol  (VENTOLIN  HFA) 108 (90 Base) MCG/ACT inhaler Inhale 2 puffs into the lungs every 4 (four) hours as needed for wheezing or shortness of  breath (or coughing). 07/25/22   Raford Lenis, MD  cephALEXin  (KEFLEX ) 500 MG capsule Take 1 capsule (500 mg total) by mouth 4 (four) times daily. 01/26/24   Neysa Caron PARAS, DO  dicyclomine  (BENTYL ) 20 MG tablet Take 1 tablet (20 mg total) by mouth 2 (two) times daily as needed for spasms. 11/17/21   Elnor Jayson LABOR, DO  ibuprofen  (ADVIL ,MOTRIN ) 600 MG tablet Take 1 tablet (600 mg total) by mouth every 6 (six) hours as needed. 11/24/14   Leftwich-Kirby, Olam LABOR, CNM  ondansetron  (ZOFRAN ) 4 MG tablet Take 1 tablet (4 mg total) by mouth every 6 (six) hours. 01/26/24   Neysa Caron PARAS, DO  ondansetron  (ZOFRAN -ODT) 8 MG disintegrating tablet Take 1 tablet (8 mg total) by mouth every 8 (eight) hours as needed for nausea or vomiting. 07/25/22   Raford Lenis, MD  oxybutynin  (DITROPAN  XL) 10 MG 24 hr tablet Take 1 tablet (10 mg total) by mouth at bedtime. 04/29/22   Prosperi, Christian H, PA-C  predniSONE  (DELTASONE ) 50 MG tablet Take 1 tablet (50 mg total) by mouth daily. 07/25/22   Raford Lenis, MD    Allergies: Patient has no known allergies.    Review of Systems  Constitutional:  Negative for activity change, appetite change and fever.  HENT:  Negative for congestion and rhinorrhea.   Respiratory:  Positive for cough and chest  tightness.   Cardiovascular:  Positive for chest pain.  Gastrointestinal:  Negative for abdominal pain, nausea and vomiting.  Genitourinary:  Negative for dysuria and hematuria.  Musculoskeletal:  Negative for arthralgias and myalgias.  Skin:  Negative for rash.  Neurological:  Negative for dizziness, weakness and headaches.    all other systems are negative except as noted in the HPI and PMH.   Updated Vital Signs BP (!) 118/100   Pulse 93   Temp (!) 97.1 F (36.2 C)   Resp 20   Ht 4' 11 (1.499 m)   Wt 53.5 kg   SpO2 100%   BMI 23.83 kg/m   Physical Exam Vitals and nursing note reviewed.  Constitutional:      General: She is not in acute distress.    Appearance: She is  well-developed.  HENT:     Head: Normocephalic and atraumatic.     Mouth/Throat:     Pharynx: No oropharyngeal exudate.  Eyes:     Conjunctiva/sclera: Conjunctivae normal.     Pupils: Pupils are equal, round, and reactive to light.  Neck:     Comments: No meningismus. Cardiovascular:     Rate and Rhythm: Normal rate and regular rhythm.     Heart sounds: Normal heart sounds. No murmur heard. Pulmonary:     Effort: Pulmonary effort is normal. No respiratory distress.     Breath sounds: Normal breath sounds. No wheezing.  Chest:     Chest wall: No tenderness.  Abdominal:     Palpations: Abdomen is soft.     Tenderness: There is no abdominal tenderness. There is no guarding or rebound.  Musculoskeletal:        General: No tenderness. Normal range of motion.     Cervical back: Normal range of motion and neck supple.  Skin:    General: Skin is warm.  Neurological:     Mental Status: She is alert and oriented to person, place, and time.     Cranial Nerves: No cranial nerve deficit.     Motor: No abnormal muscle tone.     Coordination: Coordination normal.     Comments:  5/5 strength throughout. CN 2-12 intact.Equal grip strength.   Psychiatric:        Behavior: Behavior normal.     (all labs ordered are listed, but only abnormal results are displayed) Labs Reviewed  BASIC METABOLIC PANEL WITH GFR - Abnormal; Notable for the following components:      Result Value   Glucose, Bld 100 (*)    All other components within normal limits  CBC  PREGNANCY, URINE  D-DIMER, QUANTITATIVE  TROPONIN T, HIGH SENSITIVITY  TROPONIN T, HIGH SENSITIVITY    EKG: EKG Interpretation Date/Time:  Tuesday April 07 2024 00:57:42 EDT Ventricular Rate:  69 PR Interval:  115 QRS Duration:  104 QT Interval:  373 QTC Calculation: 400 R Axis:   68  Text Interpretation: Sinus rhythm Borderline short PR interval No significant change was found Confirmed by Carita Senior [phone]) on 04/07/2024  1:06:51 AM  Radiology: [facility] Chest 2 View Result Date: 04/07/2024 EXAM: 2 VIEW(S) XRAY OF THE CHEST 04/07/2024 12:54:00 AM COMPARISON: None available. CLINICAL HISTORY: Chest tightness, history of bronchitis. Chest heaviness starting yesterday, states history of bronchitis. States coughed up brown mucus. Pt states when she was laying down she had a hard time catching her breath. LMP=03/19/24. Pt shielded. Pt did say pain on right lateral side of chest. FINDINGS: LUNGS AND PLEURA: No focal pulmonary opacity.  No pulmonary edema. No pleural effusion. No pneumothorax. HEART AND MEDIASTINUM: No acute abnormality of the cardiac and mediastinal silhouettes. BONES AND SOFT TISSUES: No acute osseous abnormality. IMPRESSION: 1. No acute process. Electronically signed by: Dorethia Molt MD 04/07/2024 12:58 AM EDT RP Workstation: [id number]     Procedures   Medications Ordered in the ED  ipratropium-albuterol  (DUONEB) 0.5-2.5 (3) MG/3ML nebulizer solution 3 mL (has no administration in time range)                                    Medical Decision Making Amount and/or Complexity of Data Reviewed Labs: ordered. Decision-making details documented in ED Course. Radiology: ordered and independent interpretation performed. Decision-making details documented in ED Course. ECG/medicine tests: ordered and independent interpretation performed. Decision-making details documented in ED Course.  Risk Prescription drug management.   1 day of central chest pressure and pleuritic pain with breathing.  No hypoxia or increased work of breathing.  Clear lungs without wheezing.  EKG normal sinus rhythm.  No ST changes.  No Brugada, no prolonged QT.  Bronchodilators given.  Minimal wheezing on arrival.  Chest x-ray negative for infiltrate or pneumothorax.  Results reviewed and interpreted by me.  D-dimer obtained given her pleuritic chest pain.  This is negative.  Troponin negative with low concern for ACS or PE.  Will  treat as bronchitis with bronchodilators and course of antibiotics given her ongoing productive cough.  No pneumonia seen on chest x-ray today.  Troponin negative x 2.  Doubt ACS.  Patient feels improved after bronchodilators.  Will give antibiotics given ongoing productive cough.  D-dimer negative with low concern for PE.  Discussed bronchodilators, antibiotics, PCP follow-up.  Return to the ED with exertional chest pain, pain associated with shortness of breath, nausea, vomiting, sweating or any other concerns.      Final diagnoses:  Atypical chest pain  Bronchitis    ED Discharge Orders     None          Eyoel Throgmorton, Garnette, MD 04/07/24 [phone]

## 2024-04-07 NOTE — ED Triage Notes (Signed)
 Chest heaviness starting yesterday states Hx if broncitis. States coughed up brown mucus.
